# Patient Record
Sex: Male | Born: 1950
Health system: Southern US, Community
[De-identification: ages and names within clinical notes are randomized; demographics above are authoritative.]

## PROBLEM LIST (undated history)

## (undated) DIAGNOSIS — G4730 Sleep apnea, unspecified: Secondary | ICD-10-CM

## (undated) DIAGNOSIS — M1990 Unspecified osteoarthritis, unspecified site: Secondary | ICD-10-CM

## (undated) DIAGNOSIS — C801 Malignant (primary) neoplasm, unspecified: Secondary | ICD-10-CM

## (undated) DIAGNOSIS — K219 Gastro-esophageal reflux disease without esophagitis: Secondary | ICD-10-CM

## (undated) DIAGNOSIS — R7303 Prediabetes: Secondary | ICD-10-CM

## (undated) HISTORY — DX: Sleep apnea, unspecified: G47.30

## (undated) HISTORY — PX: ANAL FISSURE REPAIR: SHX2312

---

## 2004-08-16 ENCOUNTER — Ambulatory Visit: Payer: Self-pay | Admitting: Unknown Physician Specialty

## 2005-08-08 ENCOUNTER — Ambulatory Visit: Payer: Self-pay | Admitting: Gastroenterology

## 2014-05-14 DIAGNOSIS — R079 Chest pain, unspecified: Secondary | ICD-10-CM | POA: Insufficient documentation

## 2014-05-14 DIAGNOSIS — R0609 Other forms of dyspnea: Secondary | ICD-10-CM

## 2014-05-14 DIAGNOSIS — R5383 Other fatigue: Secondary | ICD-10-CM | POA: Insufficient documentation

## 2014-05-14 DIAGNOSIS — R0602 Shortness of breath: Secondary | ICD-10-CM | POA: Insufficient documentation

## 2015-04-14 ENCOUNTER — Encounter: Payer: Self-pay | Admitting: Family Medicine

## 2015-04-14 ENCOUNTER — Ambulatory Visit (INDEPENDENT_AMBULATORY_CARE_PROVIDER_SITE_OTHER): Payer: BLUE CROSS/BLUE SHIELD | Admitting: Family Medicine

## 2015-04-14 VITALS — BP 136/85 | HR 66 | Temp 97.8°F | Ht 71.5 in | Wt 241.0 lb

## 2015-04-14 DIAGNOSIS — H109 Unspecified conjunctivitis: Secondary | ICD-10-CM | POA: Diagnosis not present

## 2015-04-14 MED ORDER — POLYMYXIN B-TRIMETHOPRIM 10000-0.1 UNIT/ML-% OP SOLN: 1.0000 [drp] | OPHTHALMIC | Status: DC

## 2015-04-14 NOTE — Progress Notes (Signed)
   BP 136/85 mmHg  Pulse 66  Temp(Src) 97.8 F (36.6 C)  Ht 5' 11.5" (1.816 m)  Wt 241 lb (109.317 kg)  BMI 33.15 kg/m2  SpO2 95%   Subjective:    Patient ID: Luke Cain, male    DOB: [birth year], 64 y.o.   MRN: [id number]  HPI: Luke Cain is a 64 y.o. male  Chief Complaint  Patient presents with  . left eye pain   patient with pus and mattering developing in his left eye over the last 2-3 days no foreign body sensation no problems with vision. Has had some yellowish pus collecting in his eye on a regular basis. Patient does little work and dust is around a lot.  Relevant past medical, surgical, family and social history reviewed and updated as indicated. Interim medical history since our last visit reviewed. Allergies and medications reviewed and updated.  Review of Systems  Constitutional: Negative.   Respiratory: Negative.   Cardiovascular: Negative.     Per HPI unless specifically indicated above     Objective:    BP 136/85 mmHg  Pulse 66  Temp(Src) 97.8 F (36.6 C)  Ht 5' 11.5" (1.816 m)  Wt 241 lb (109.317 kg)  BMI 33.15 kg/m2  SpO2 95%  Wt Readings from Last 3 Encounters:  08/17/14 235 lb (106.595 kg)  04/14/15 241 lb (109.317 kg)    Physical Exam  Eyes:  Right eye clear left eye with pus mattering. Full range of motion no evidence of foreign body and injected conjunctiva.    No results found for this or any previous visit.    Assessment & Plan:   Problem List Items Addressed This Visit    None    Visit Diagnoses    Conjunctivitis of left eye    -  Primary    Discussed eye care and use of medications. Specially discussed handwashing and sanitation        Follow up plan: Return if symptoms worsen or fail to improve.

## 2015-09-06 ENCOUNTER — Encounter (INDEPENDENT_AMBULATORY_CARE_PROVIDER_SITE_OTHER): Payer: Self-pay | Admitting: Family Medicine

## 2015-09-06 DIAGNOSIS — Z0289 Encounter for other administrative examinations: Secondary | ICD-10-CM

## 2015-09-06 NOTE — Progress Notes (Unsigned)
Patient ID: Luke Cain, male   DOB: 1951-07-15, 64 y.o.   MRN: [id number] [employer]

## 2016-02-07 ENCOUNTER — Ambulatory Visit
Admission: EM | Admit: 2016-02-07 | Discharge: 2016-02-07 | Disposition: A | Discharge status: Home / Self Care | Payer: Commercial Managed Care - HMO | Attending: Family Medicine | Admitting: Family Medicine

## 2016-02-07 ENCOUNTER — Encounter: Payer: Self-pay | Admitting: Emergency Medicine

## 2016-02-07 DIAGNOSIS — W57XXXA Bitten or stung by nonvenomous insect and other nonvenomous arthropods, initial encounter: Secondary | ICD-10-CM

## 2016-02-07 DIAGNOSIS — S30861A Insect bite (nonvenomous) of abdominal wall, initial encounter: Secondary | ICD-10-CM | POA: Diagnosis not present

## 2016-02-07 MED ORDER — DOXYCYCLINE HYCLATE 100 MG PO CAPS: 100.0000 mg | ORAL_CAPSULE | Freq: Two times a day (BID) | ORAL | Status: AC

## 2016-02-07 NOTE — ED Provider Notes (Signed)
CSN: [id number]     Arrival date & time 02/07/16  0902 History   First MD Initiated Contact with Patient 02/07/16 [phone]     Chief Complaint  Patient presents with  . Insect Bite   (Consider location/radiation/quality/duration/timing/severity/associated sxs/prior Treatment) HPI Comments: Married caucasian male here for rash after tick bite engorged on abdomen two weeks ago spouse removed thinks he may have Lyme disease and need treatment would like blood tests along with treatment.  Headache, nausea, bullseye rash abdomen and just not feeling well yesterday.  Today a little better.  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  PMHx tobacco use stopped, chest pain, conjuctivitis PSHx denied  FHx F-CHF and diabetes  The history is provided by the patient.    Past Medical History  Diagnosis Date  . Sleep apnea    Past Surgical History  Procedure Laterality Date  . Anal fissure repair     Family History  Problem Relation Age of Onset  . Thyroid disease Mother   . Diabetes Father   . Heart disease Father    Social History  Substance Use Topics  . Smoking status: Former Smoker    Quit date: 04/14/1999  . Smokeless tobacco: Never Used  . Alcohol Use: Yes    Review of Systems  Constitutional: Positive for fatigue. Negative for fever, chills, diaphoresis, activity change, appetite change and unexpected weight change.  HENT: Negative for congestion, dental problem, drooling, ear discharge, ear pain, facial swelling, hearing loss, mouth sores, nosebleeds, postnasal drip, rhinorrhea, sinus pressure, sneezing, sore throat, tinnitus, trouble swallowing and voice change.   Eyes: Negative for photophobia, pain, discharge, redness, itching and visual disturbance.  Respiratory: Negative for cough, choking, chest tightness, shortness of breath, wheezing and stridor.   Cardiovascular: Negative for chest pain, palpitations and leg swelling.  Gastrointestinal: Positive for  nausea. Negative for vomiting, abdominal pain, diarrhea, constipation, blood in stool and abdominal distention.  Endocrine: Negative for cold intolerance and heat intolerance.  Genitourinary: Negative for dysuria.  Musculoskeletal: Negative for myalgias, back pain, joint swelling, arthralgias, gait problem, neck pain and neck stiffness.  Skin: Positive for color change and rash. Negative for pallor and wound.  Allergic/Immunologic: Positive for environmental allergies. Negative for food allergies and immunocompromised state.  Neurological: Positive for headaches. Negative for dizziness, tremors, seizures, syncope, facial asymmetry, speech difficulty, weakness, light-headedness and numbness.  Hematological: Negative for adenopathy. Does not bruise/bleed easily.  Psychiatric/Behavioral: Negative for behavioral problems, confusion, sleep disturbance and agitation.    Allergies  Review of patient's allergies indicates no known allergies.  Home Medications   Prior to Admission medications   Medication Sig Start Date End Date Taking? Authorizing Provider  alprostadil (EDEX) 40 MCG injection 40 mcg by Intracavitary route as needed for erectile dysfunction. use no more than 3 times per week    Historical Provider, MD  doxycycline (VIBRAMYCIN) 100 MG capsule Take 1 capsule (100 mg total) by mouth 2 (two) times daily. 02/07/16 02/28/16  Olen Cordial, NP   Meds Ordered and Administered this Visit  Medications - No data to display  BP 144/90 mmHg  Pulse 64  Temp(Src) 97.8 F (36.6 C) (Oral)  Resp 18  Ht 6' (1.829 m)  Wt 250 lb (113.399 kg)  BMI 33.90 kg/m2  SpO2 98% No data found.   Physical Exam  Constitutional: He is oriented to person, place, and time. Vital signs are normal. He appears well-developed and well-nourished. He is active and cooperative.  Non-toxic appearance.  He does not have a sickly appearance. He does not appear ill. No distress.  HENT:  Head: Normocephalic and  atraumatic.  Right Ear: Hearing, external ear and ear canal normal.  Left Ear: Hearing, external ear and ear canal normal.  Nose: No mucosal edema, rhinorrhea, nose lacerations, sinus tenderness, nasal deformity, septal deviation or nasal septal hematoma. No epistaxis.  No foreign bodies. Right sinus exhibits no maxillary sinus tenderness and no frontal sinus tenderness. Left sinus exhibits no maxillary sinus tenderness and no frontal sinus tenderness.  Mouth/Throat: Uvula is midline and mucous membranes are normal. Mucous membranes are not pale, not dry and not cyanotic. He does not have dentures. No oral lesions. No trismus in the jaw. Normal dentition. No dental abscesses, uvula swelling, lacerations or dental caries. No oropharyngeal exudate, posterior oropharyngeal edema, posterior oropharyngeal erythema or tonsillar abscesses.  Eyes: Conjunctivae, EOM and lids are normal. Pupils are equal, round, and reactive to light. Right eye exhibits no chemosis, no discharge, no exudate and no hordeolum. No foreign body present in the right eye. Left eye exhibits no chemosis, no discharge, no exudate and no hordeolum. No foreign body present in the left eye. Right conjunctiva is not injected. Right conjunctiva has no hemorrhage. Left conjunctiva is not injected. Left conjunctiva has no hemorrhage. No scleral icterus. Right eye exhibits normal extraocular motion and no nystagmus. Left eye exhibits normal extraocular motion and no nystagmus. Right pupil is round and reactive. Left pupil is round and reactive. Pupils are equal.  Neck: Trachea normal and normal range of motion. Neck supple. No tracheal tenderness, no spinous process tenderness and no muscular tenderness present. No rigidity. No tracheal deviation, no edema, no erythema and normal range of motion present. No thyroid mass and no thyromegaly present.  Cardiovascular: Normal rate, regular rhythm, S1 normal, S2 normal, normal heart sounds and intact distal  pulses.  PMI is not displaced.  Exam reveals no gallop and no friction rub.   No murmur heard. Pulmonary/Chest: Effort normal and breath sounds normal. No stridor. No respiratory distress. He has no decreased breath sounds. He has no wheezes. He has no rhonchi. He has no rales.  Abdominal: Soft. Bowel sounds are normal. He exhibits no distension. There is no hepatosplenomegaly. There is no tenderness. Hernia confirmed negative in the ventral area.  Musculoskeletal: Normal range of motion. He exhibits no edema or tenderness.       Right shoulder: Normal.       Left shoulder: Normal.       Right elbow: Normal.      Left elbow: Normal.       Right hip: Normal.       Left hip: Normal.       Right knee: Normal.       Left knee: Normal.       Cervical back: Normal.       Right hand: Normal.       Left hand: Normal.  Lymphadenopathy:       Head (right side): No submental, no submandibular, no tonsillar, no preauricular, no posterior auricular and no occipital adenopathy present.       Head (left side): No submental, no submandibular, no tonsillar, no preauricular, no posterior auricular and no occipital adenopathy present.    He has no cervical adenopathy.       Right cervical: No superficial cervical, no deep cervical and no posterior cervical adenopathy present.      Left cervical: No superficial cervical, no deep cervical and  no posterior cervical adenopathy present.  Neurological: He is alert and oriented to person, place, and time. He displays no atrophy and no tremor. No cranial nerve deficit or sensory deficit. He exhibits normal muscle tone. He displays no seizure activity. Coordination and gait normal. GCS eye subscore is 4. GCS verbal subscore is 5. GCS motor subscore is 6.  Skin: Skin is warm, dry and intact. Rash noted. No abrasion, no bruising, no burn, no ecchymosis, no laceration, no lesion and no petechiae noted. Rash is macular and maculopapular. He is not diaphoretic. There is  erythema. No cyanosis. No pallor. Nails show no clubbing.     2 brown scabs 56mm left upper midline abdomen surrounded by macular erythema approximately 2cm diameter  Psychiatric: He has a normal mood and affect. His speech is normal and behavior is normal. Judgment and thought content normal. Cognition and memory are normal.  Nursing note and vitals reviewed.   ED Course  Procedures (including critical care time)  Labs Review Labs Reviewed  ROCKY MTN SPOTTED FVR ABS PNL(IGG+IGM)  B. BURGDORFI ANTIBODIES    Imaging Review No results found.  1015 Lyme and RMSF antibodies drawn/venipuncture by RN Tula Nakayama.   MDM   1. Tick bite of abdomen, initial encounter    Has been longer than 72 hours.  Patient requesting labs and treatment therefore will start with 10 days doxycycline 100mg  po BID and extend to 21 days if labs positive.  Bullseye rash abdomen and general malaise today.  Discussed labs ship out approximately 48 hours to get results.  Patient to follow up with PCM or [place] if worsening symptoms or new symptoms for re-evaluation e.g. Fever, lethargy, headache, hematemesis/hematuria/hemoptysis, wheezing, rash.  Patient given exitcare handout on lyme disease, [place] Spotted fever and tick bites.  Refused work note.  Avoid scratching affected area follow up if worsening erythema or purulent discharge despite doxycycline use x 48 hours for re-evaluation.  Patient verbalized understanding of information/instructions, agreed with plan of care and had no further questions at this time.    Olen Cordial, NP 02/07/16 1024

## 2016-02-07 NOTE — ED Notes (Signed)
Pt with tick bite two weeks ago and was feeling sick yesterday with some nausea.

## 2016-02-07 NOTE — Discharge Instructions (Signed)
Tick Bite Information Ticks are insects that attach themselves to the skin and draw blood for food. There are various types of ticks. Common types include wood ticks and deer ticks. Most ticks live in shrubs and grassy areas. Ticks can climb onto your body when you make contact with leaves or grass where the tick is waiting. The most common places on the body for ticks to attach themselves are the scalp, neck, armpits, waist, and groin. Most tick bites are harmless, but sometimes ticks carry germs that cause diseases. These germs can be spread to a person during the tick's feeding process. The chance of a disease spreading through a tick bite depends on:   The type of tick.  Time of year.   How long the tick is attached.   Geographic location.  HOW CAN YOU PREVENT TICK BITES? Take these steps to help prevent tick bites when you are outdoors:  Wear protective clothing. Long sleeves and long pants are best.   Wear [place] clothes so you can see ticks more easily.  Tuck your pant legs into your socks.   If walking on a trail, stay in the middle of the trail to avoid brushing against bushes.  Avoid walking through areas with long grass.  Put insect repellent on all exposed skin and along boot tops, pant legs, and sleeve cuffs.   Check clothing, hair, and skin repeatedly and before going inside.   Brush off any ticks that are not attached.  Take a shower or bath as soon as possible after being outdoors.  WHAT IS THE PROPER WAY TO REMOVE A TICK? Ticks should be removed as soon as possible to help prevent diseases caused by tick bites.  If latex gloves are available, put them on before trying to remove a tick.   Using fine-point tweezers, grasp the tick as close to the skin as possible. You may also use curved forceps or a tick removal tool. Grasp the tick as close to its head as possible. Avoid grasping the tick on its body.  Pull gently with steady upward pressure until the  tick lets go. Do not twist the tick or jerk it suddenly. This may break off the tick's head or mouth parts.  Do not squeeze or crush the tick's body. This could force disease-carrying fluids from the tick into your body.   After the tick is removed, wash the bite area and your hands with soap and water or other disinfectant such as alcohol.  Apply a small amount of antiseptic cream or ointment to the bite site.   Wash and disinfect any instruments that were used.  Do not try to remove a tick by applying a hot match, petroleum jelly, or fingernail polish to the tick. These methods do not work and may increase the chances of disease being spread from the tick bite.  WHEN SHOULD YOU SEEK MEDICAL CARE? Contact your health care provider if you are unable to remove a tick from your skin or if a part of the tick breaks off and is stuck in the skin.  After a tick bite, you need to be aware of signs and symptoms that could be related to diseases spread by ticks. Contact your health care provider if you develop any of the following in the days or weeks after the tick bite:  Unexplained fever.  Rash. A circular rash that appears days or weeks after the tick bite may indicate the possibility of Lyme disease. The rash may resemble  a target with a bull's-eye and may occur at a different part of your body than the tick bite.  Redness and swelling in the area of the tick bite.   Tender, swollen lymph glands.   Diarrhea.   Weight loss.   Cough.   Fatigue.   Muscle, joint, or bone pain.   Abdominal pain.   Headache.   Lethargy or a change in your level of consciousness.  Difficulty walking or moving your legs.   Numbness in the legs.   Paralysis.  Shortness of breath.   Confusion.   Repeated vomiting.    This information is not intended to replace advice given to you by your health care provider. Make sure you discuss any questions you have with your health care  provider.   Document Released: 09/08/2000 Document Revised: 10/02/2014 Document Reviewed: 02/19/2013 Elsevier Interactive Patient Education 2016 Else[employer] Lyme Disease Lyme disease is an infection that affects many parts of the body, including the skin, joints, and nervous system. CAUSES Lyme disease is caused by bacteria called Borrelia burgdorferi. You can get Lyme disease by being bitten by an infected tick. The tick must be attached to your skin for at least 36 hours to transmit the infection. Deer often carry infected ticks. RISK FACTORS  Living in or visiting [place] states, or the [place].  Spending time in wooded or grassy areas.  Being outdoors with exposed skin.  Failing to remove a tick from your skin within 3-4 days. SIGNS AND SYMPTOMS  A round, red rash that comes out from the center of the tick bite. This is the first sign of infection. The center of the rash may be blood colored or have tiny blisters.  Fatigue.  Headache.  Chills and fever.  General achiness.  Joint pain, often in the knee.  Swollen lymph glands. DIAGNOSIS Lyme disease is diagnosed with a medical history, physical exam, and blood test. TREATMENT The main treatment is antibiotic medicine, usually taken by mouth. The length of treatment depends on how soon after a tick bite you begin taking the medicine. In some cases, treatment is necessary for several weeks. If the infection is severe, IV antibiotics may be necessary. HOME CARE INSTRUCTIONS  Take your antibiotic medicine as directed by your health care provider. Finish the antibiotic even if you start to feel better.  You may take a probiotic in between doses of your antibiotic to help avoid stomach upset or diarrhea.  Check with your health care provider before supplementing your treatment. Many alternative therapies have not been proven and may be harmful to you.  Keep all follow-up visits as directed by  your health care provider. This is important. PREVENTION Reinfection is possible with another tick bite by an infected tick. Take these precautions to prevent an infection:  Cover your skin with light-colored clothing when outdoors in the spring and summer months.  Spray clothing and skin with bug spray. The spray should be 20-30% DEET.  Avoided wooded, grassy, and shaded areas.  Remove yard litter, brush, trash, and plants that attract deer and rodents.  Check yourself for ticks when you come indoors.  Wash clothing worn each day.  Check your pets for ticks before they come inside.  If you find a tick:  Remove it with tweezers.  Clean your hands and the bite area with rubbing alcohol or soap and water. Pregnant women should take special care to avoid tick bites because the infection can be passed along  to the fetus. SEEK MEDICAL CARE IF:  You have symptoms after treatment.  You have removed a tick and want to bring it to your health care provider for testing. SEEK IMMEDIATE MEDICAL CARE IF:  You have an irregular heartbeat.  You have nerve pain.  Your face feels numb. MAKE SURE YOU:  Understand these instructions.  Will watch your condition.  Will get help right away if you are not doing well or get worse.   This information is not intended to replace advice given to you by your health care provider. Make sure you discuss any questions you have with your health care provider.   Document Released: 12/18/2000 Document Revised: 10/02/2014 Document Reviewed: 01/27/2014 Elsevier Interactive Patient Education 2016 [place] Spotted Fever [facility] spotted fever is an illness that is spread to people by infected ticks. The illness causes flulike symptoms and a reddish-purple rash. This illness can quickly become very serious. Treatment must be started right away. When the illness is not treated right away, it can sometimes lead to long-term health  problems or even death. This illness is most common during warm weather when ticks are most active. CAUSES [facility] spotted fever is caused by a type of bacteria that is called Rickettsia rickettsii. This type of bacteria is carried by [place] dog ticks and [employer]. People get infected through a bite from a tick that is infected with the bacteria. The bite is painless, and it frequently goes unnoticed. The bacteria can also infect a person when tick blood or tick feces get into a person's body through damaged skin. A tick bite is not necessary for an infection to occur. People can get [facility] spotted fever if they get a tick's blood or body fluids on their skin in the area of a small cut or sore. This could happen while removing a tick from another person or a dog. The infection is not contagious, and it cannot be spread (transmitted) from person to person. SIGNS AND SYMPTOMS Symptoms may begin 2-14 days after a tick bite. The most common early symptoms are:  Fever.  Muscle aches.  Headache.  Nausea.  Vomiting.  Poor appetite.  Abdominal pain. The reddish-purple rash usually appears 3-5 days after the first symptoms begin. The rash often starts on the wrists and ankles. It may then spread to the palms, the soles of the feet, the legs, and the trunk. DIAGNOSIS Diagnosis is based on a physical exam, medical history, and blood tests. Your health care provider may suspect [facility] spotted fever in one of these cases:   If you have recently been bitten by a tick.  If you have been in areas that have a lot of ticks or in areas where the disease is common. TREATMENT It is important to begin treatment right away. Treatment will usually involve the use of antibiotic medicines. In some cases, your health care provider may begin treatment before the diagnosis is confirmed. If your symptoms are severe, a hospital stay may be needed. HOME CARE  INSTRUCTIONS  Rest as much as possible until you feel better.  Take medicines only as directed by your health care provider.  Take your antibiotic medicine as directed by your health care provider. Finish the antibiotic even if you start to feel better.  Drink enough fluid to keep your urine clear or pale yellow.  Keep all follow-up visits as directed by your health care provider. This is important. PREVENTION Avoiding tick  bites can help to prevent this illness. Take these steps to avoid tick bites when you are outdoors:  Be aware that most ticks live in shrubs, low tree branches, and grassy areas. A tick can climb onto your body when you make contact with leaves or grass where the tick is waiting.  Wear protective clothing. Long sleeves and long pants are best.  Wear white clothes so you can see ticks more easily.  Tuck your pant legs into your socks.  If you go walking on a trail, stay in the middle of the trail to avoid brushing against bushes.  Avoid walking through areas that have long grass.  Put insect repellent on all exposed skin and along boot tops, pant legs, and sleeve cuffs.  Check clothing, hair, and skin repeatedly and before going inside.  Check family members and pets for ticks.  Brush off any ticks that are not attached.  Take a shower or a bath as soon as possible after you have been outdoors. Check your skin for ticks. The most common places on the body where ticks attach themselves are the scalp, neck, armpits, waist, and groin. You can also greatly reduce your chances of getting [facility] spotted fever if you remove attached ticks as soon as possible. To remove an attached tick, use a forceps or fine-point tweezers to detach the intact tick without leaving its mouth parts in the skin. The wound from the tick bite should be washed after the tick has been removed. SEEK MEDICAL CARE IF:  You have drainage, swelling, or increased redness or pain in the  area of the rash. SEEK IMMEDIATE MEDICAL CARE IF:  You have chest pain.  You have shortness of breath.  You have a severe headache.  You have a seizure.  You have severe abdominal pain.  You are feeling confused.  You are bruising easily.  You have bleeding from your gums.  You have blood in your stool.   This information is not intended to replace advice given to you by your health care provider. Make sure you discuss any questions you have with your health care provider.   Document Released: 12/24/2000 Document Revised: 10/02/2014 Document Reviewed: 04/27/2014 Elsevier Interactive Patient Education [employer].

## 2016-02-08 ENCOUNTER — Ambulatory Visit: Payer: Commercial Managed Care - HMO | Admitting: Unknown Physician Specialty

## 2016-02-09 LAB — ROCKY MTN SPOTTED FVR ABS PNL(IGG+IGM)
RMSF IgG: POSITIVE — AB
RMSF IgM: 1.16 index — ABNORMAL HIGH (ref 0.00–0.89)

## 2016-02-09 LAB — B. BURGDORFI ANTIBODIES: B burgdorferi Ab IgG+IgM: <0.91 {ISR} (ref 0.00–0.90)

## 2016-02-09 LAB — RMSF, IGG, IFA: RMSF, IGG, IFA: 1:256 {titer} — ABNORMAL HIGH

## 2016-02-12 ENCOUNTER — Telehealth: Payer: Self-pay | Admitting: Family Medicine

## 2016-02-12 NOTE — Telephone Encounter (Signed)
Patient notified via telephone Lyme test negative but RMSF IgG and IgM both positive.  Continue doxycycline 100mg  po BID as previously discussed.  Patient reported still taking advil prn mild fever.  Patient to follow up with [facility] for re-evaluation if fever lasting longer than 1 week, headache, GI symptoms, myalgia, continued rash as therapy may require adjustment.  Try to avoid further tick bites use DEET/permethrin spray/clothing to prevent tick adherence.  Patient verbalized understanding information/instructions, agreed with plan of care and had no further questions at this time.

## 2016-02-15 ENCOUNTER — Encounter: Payer: Self-pay | Admitting: Family Medicine

## 2016-02-15 ENCOUNTER — Ambulatory Visit (INDEPENDENT_AMBULATORY_CARE_PROVIDER_SITE_OTHER): Payer: Commercial Managed Care - HMO | Admitting: Family Medicine

## 2016-02-15 VITALS — BP 122/82 | HR 80 | Temp 97.8°F | Ht 71.5 in | Wt 247.0 lb

## 2016-02-15 DIAGNOSIS — A770 Spotted fever due to Rickettsia rickettsii: Secondary | ICD-10-CM

## 2016-02-15 NOTE — Progress Notes (Signed)
BP 122/82 mmHg  Pulse 80  Temp(Src) 97.8 F (36.6 C)  Ht 5' 11.5" (1.816 m)  Wt 247 lb (112.038 kg)  BMI 33.97 kg/m2  SpO2 98%   Subjective:    Patient ID: Luke Cain, male    DOB: 01-26-1951, 65 y.o.   MRN: [id number]  HPI: [name] is a 65 y.o. male  Chief Complaint  Patient presents with  . follow up for RMSF  Patient diagnosed week ago with RMSF has been taking doxycycline twice a day without fail to slowly improving and having some problems with decision-making and clear thinking. Patient with no rash no fever or chills just feels bad with systemic symptoms and some generalized aching. No sunburn with doxycycline use and sun precautions. Patient concerned not getting better and this or something else to do.  Relevant past medical, surgical, family and social history reviewed and updated as indicated. Interim medical history since our last visit reviewed. Allergies and medications reviewed and updated.  Review of Systems  Constitutional: Positive for diaphoresis and fatigue. Negative for fever and chills.  Respiratory: Negative for cough, choking, chest tightness and shortness of breath.   Cardiovascular: Negative.   Gastrointestinal: Negative.   Skin: Negative.     Per HPI unless specifically indicated above     Objective:    BP 122/82 mmHg  Pulse 80  Temp(Src) 97.8 F (36.6 C)  Ht 5' 11.5" (1.816 m)  Wt 247 lb (112.038 kg)  BMI 33.97 kg/m2  SpO2 98%  Wt Readings from Last 3 Encounters:  02/15/16 247 lb (112.038 kg)  02/07/16 250 lb (113.399 kg)  09/06/15 237 lb (107.502 kg)    Physical Exam  Constitutional: He is oriented to person, place, and time. He appears well-developed and well-nourished. No distress.  HENT:  Head: Normocephalic and atraumatic.  Right Ear: Hearing and external ear normal.  Left Ear: Hearing and external ear normal.  Nose: Nose normal.  Mouth/Throat: Oropharynx is clear and moist.  Eyes: Conjunctivae and lids are  normal. Right eye exhibits no discharge. Left eye exhibits no discharge. No scleral icterus.  Cardiovascular: Normal rate, regular rhythm and normal heart sounds.   Pulmonary/Chest: Effort normal and breath sounds normal. No respiratory distress. He has no wheezes. He has no rales.  Abdominal: Soft. Bowel sounds are normal.  Musculoskeletal: Normal range of motion.  Neurological: He is alert and oriented to person, place, and time.  Skin: Skin is intact. No rash noted.  Psychiatric: He has a normal mood and affect. His speech is normal and behavior is normal. Judgment and thought content normal. Cognition and memory are normal.    Results for orders placed or performed during the hospital encounter of 02/07/16  Rocky mtn spotted fvr abs pnl(IgG+IgM)  Result Value Ref Range   RMSF IgG Positive (A) Negative   RMSF IgM 1.16 (H) 0.00 - 0.89 index  B. burgdorfi antibodies  Result Value Ref Range   B burgdorferi Ab IgG+IgM <0.91 0.00 - 0.90 ISR  RMSF, IgG, IFA  Result Value Ref Range   RMSF, IGG, IFA 1:256 (H) Neg <1:64      Assessment & Plan:   Problem List Items Addressed This Visit    None    Visit Diagnoses    RMSF [facility] spotted fever)    -  Primary    Slow resolution as per usual course will continue doxycycline medication education given especially on mental alertness and decision making  Discussed continuing doxycycline until completely done. Follow up plan: Return for As scheduled.

## 2016-05-15 ENCOUNTER — Telehealth: Payer: Self-pay

## 2016-05-15 NOTE — Telephone Encounter (Signed)
Patient has injured his calf muscle

## 2016-10-23 ENCOUNTER — Telehealth: Payer: Self-pay

## 2016-10-23 DIAGNOSIS — R600 Localized edema: Secondary | ICD-10-CM

## 2016-10-23 NOTE — Telephone Encounter (Signed)
Call pt 

## 2016-10-23 NOTE — Telephone Encounter (Signed)
Patient asked if Dr. Jeananne Rama or his CMA would call him. Patient is having some issues with his leg.

## 2016-10-23 NOTE — Telephone Encounter (Signed)
Spoke with patient. He has been experiencing bilateral soreness in his lower extremities, it has been ongoing x 1 week. It started low in his legs, and has progressed upwards. Pt has noticed some swelling in legs over the last week at the top of his sock. However this morning he woke up and his left leg was very swollen. Has some redness in that leg. It is very tight, pt denies any warmth in leg. Please advise.

## 2016-10-24 ENCOUNTER — Encounter: Payer: Self-pay | Admitting: Family Medicine

## 2016-10-24 ENCOUNTER — Ambulatory Visit (INDEPENDENT_AMBULATORY_CARE_PROVIDER_SITE_OTHER): Payer: Commercial Managed Care - HMO | Admitting: Family Medicine

## 2016-10-24 ENCOUNTER — Ambulatory Visit (INDEPENDENT_AMBULATORY_CARE_PROVIDER_SITE_OTHER): Payer: Commercial Managed Care - HMO

## 2016-10-24 VITALS — BP 145/81 | HR 68 | Ht 72.0 in | Wt 242.0 lb

## 2016-10-24 DIAGNOSIS — R600 Localized edema: Secondary | ICD-10-CM

## 2016-10-24 DIAGNOSIS — Z23 Encounter for immunization: Secondary | ICD-10-CM

## 2016-10-24 DIAGNOSIS — Z131 Encounter for screening for diabetes mellitus: Secondary | ICD-10-CM | POA: Diagnosis not present

## 2016-10-24 LAB — CBC WITH DIFFERENTIAL/PLATELET
HEMATOCRIT: 41.3 % (ref 37.5–51.0)
Hemoglobin: 14.3 g/dL (ref 13.0–17.7)
Lymphocytes Absolute: 1.7 10*3/uL (ref 0.7–3.1)
Lymphs: 31 %
MCH: 31.8 pg (ref 26.6–33.0)
MCHC: 34.6 g/dL (ref 31.5–35.7)
MCV: 92 fL (ref 79–97)
MID (Absolute): 0.4 10*3/uL (ref 0.1–1.6)
MID: 8 %
NEUTROS ABS: 3.4 10*3/uL (ref 1.4–7.0)
Neutrophils: 62 %
Platelets: 116 10*3/uL — ABNORMAL LOW (ref 150–379)
RBC: 4.49 x10E6/uL (ref 4.14–5.80)
RDW: 12.9 % (ref 12.3–15.4)
WBC: 5.5 10*3/uL (ref 3.4–10.8)

## 2016-10-24 LAB — BAYER DCA HB A1C WAIVED: HB A1C: 6.1 % (ref <7.0)

## 2016-10-24 NOTE — Assessment & Plan Note (Signed)
Discuss left lower leg edema with high risk for concern of DVT. Patient's CBC was normal here. His A1c was still in the prediabetes range. Radiology pending with venous ultrasound ordered stat which will be done this morning. Reviewed old records with no prior DVT. Discuss possibilities for diagnosis for muscle strain, ankle strain, overuse injuries. Discussed various treatments but pending ultrasound report and possibility for treatment of DVT and anticoagulation.

## 2016-10-24 NOTE — Telephone Encounter (Signed)
Patient was saw by Dr. Jeananne Rama today and was sent to [facility] for DVT.

## 2016-10-24 NOTE — Progress Notes (Signed)
Ht 6' (1.829 m)   Wt 252 lb (114.3 kg)   BMI 34.18 kg/m    Subjective:    Patient ID: Luke Cain, male    DOB: 08-24-1951, 66 y.o.   MRN: [id number]  HPI: Luke Cain is a 66 y.o. male  Chief Complaint  Patient presents with  . DVT   Patient concerned about nonspecific swelling left lower leg no known trauma irritation does have a little black and blue on the left side right leg no swelling. Patient's concerned about blood clots swelling seems to be moving up his leg with discomfort also moving up his leg. This is been ongoing about a week. No prior issues with blood clots  Relevant past medical, surgical, family and social history reviewed and updated as indicated. Interim medical history since our last visit reviewed. Allergies and medications reviewed and updated.  Review of Systems  Constitutional: Negative.   HENT: Negative.   Eyes: Negative.   Respiratory: Negative.   Cardiovascular: Negative.   Gastrointestinal: Negative.   Endocrine: Negative.   Genitourinary: Negative.   Musculoskeletal: Negative.   Neurological: Negative.   Hematological: Negative.     Per HPI unless specifically indicated above     Objective:    Ht 6' (1.829 m)   Wt 252 lb (114.3 kg)   BMI 34.18 kg/m   Wt Readings from Last 3 Encounters:  10/24/16 252 lb (114.3 kg)  02/15/16 247 lb (112 kg)  02/07/16 250 lb (113.4 kg)    Physical Exam  Constitutional: He is oriented to person, place, and time. He appears well-developed and well-nourished. No distress.  HENT:  Head: Normocephalic and atraumatic.  Right Ear: Hearing normal.  Left Ear: Hearing normal.  Nose: Nose normal.  Eyes: Conjunctivae and lids are normal. Right eye exhibits no discharge. Left eye exhibits no discharge. No scleral icterus.  Neck: No JVD present. No thyromegaly present.  Cardiovascular: Normal rate, regular rhythm, normal heart sounds and intact distal pulses.   Pulmonary/Chest: Effort normal and  breath sounds normal. No respiratory distress. He has no wheezes. He has no rales. He exhibits no tenderness.  Abdominal: Soft.  Musculoskeletal: Normal range of motion. He exhibits edema.  Left leg with 3+ edema up to mid calf with some mild excoriation on the left lateral calf area with some bruising in this area. No ankle soreness no knee soreness his calf is tender to squeezing and palpation no definite cords felt  Lymphadenopathy:    He has no cervical adenopathy.  Neurological: He is alert and oriented to person, place, and time. He has normal reflexes. No cranial nerve deficit.  Skin: Skin is warm, dry and intact. No rash noted. No erythema.  Psychiatric: He has a normal mood and affect. His speech is normal and behavior is normal. Judgment and thought content normal. Cognition and memory are normal.    Results for orders placed or performed during the hospital encounter of 02/07/16  Rocky mtn spotted fvr abs pnl(IgG+IgM)  Result Value Ref Range   RMSF IgG Positive (A) Negative   RMSF IgM 1.16 (H) 0.00 - 0.89 index  B. burgdorfi antibodies  Result Value Ref Range   B burgdorferi Ab IgG+IgM <0.91 0.00 - 0.90 ISR  RMSF, IgG, IFA  Result Value Ref Range   RMSF, IGG, IFA 1:256 (H) Neg <1:64      Assessment & Plan:   Problem List Items Addressed This Visit      Other   Lower  leg edema - Primary    Discuss left lower leg edema with high risk for concern of DVT. Patient's CBC was normal here. His A1c was still in the prediabetes range. Radiology pending with venous ultrasound ordered stat which will be done this morning. Reviewed old records with no prior DVT. Discuss possibilities for diagnosis for muscle strain, ankle strain, overuse injuries. Discussed various treatments but pending ultrasound report and possibility for treatment of DVT and anticoagulation.      Relevant Orders   CBC With Differential/Platelet (STAT)   Bayer DCA Hb A1c Waived (STAT)    Other Visit  Diagnoses    Screening for diabetes mellitus (DM)       Relevant Orders   Bayer DCA Hb A1c Waived (STAT)   Need for influenza vaccination       Relevant Orders   Flu Vaccine QUAD 36+ mos PF IM (Fluarix & Fluzone Quad PF) (Completed)       Follow up plan: Return for Physical Exam, this spring.

## 2016-10-25 ENCOUNTER — Encounter (INDEPENDENT_AMBULATORY_CARE_PROVIDER_SITE_OTHER): Payer: Self-pay

## 2016-10-30 ENCOUNTER — Ambulatory Visit: Payer: Commercial Managed Care - HMO | Admitting: Family Medicine

## 2016-10-30 ENCOUNTER — Ambulatory Visit: Payer: Self-pay | Admitting: Family Medicine

## 2016-12-27 ENCOUNTER — Encounter: Payer: Self-pay | Admitting: Family Medicine

## 2016-12-27 ENCOUNTER — Ambulatory Visit (INDEPENDENT_AMBULATORY_CARE_PROVIDER_SITE_OTHER): Payer: Commercial Managed Care - HMO | Admitting: Family Medicine

## 2016-12-27 VITALS — BP 133/81 | HR 69 | Ht 71.65 in | Wt 231.0 lb

## 2016-12-27 DIAGNOSIS — N529 Male erectile dysfunction, unspecified: Secondary | ICD-10-CM

## 2016-12-27 DIAGNOSIS — Z1211 Encounter for screening for malignant neoplasm of colon: Secondary | ICD-10-CM

## 2016-12-27 DIAGNOSIS — Z13220 Encounter for screening for lipoid disorders: Secondary | ICD-10-CM | POA: Diagnosis not present

## 2016-12-27 DIAGNOSIS — Z125 Encounter for screening for malignant neoplasm of prostate: Secondary | ICD-10-CM

## 2016-12-27 DIAGNOSIS — Z0000 Encounter for general adult medical examination without abnormal findings: Secondary | ICD-10-CM

## 2016-12-27 DIAGNOSIS — N400 Enlarged prostate without lower urinary tract symptoms: Secondary | ICD-10-CM | POA: Insufficient documentation

## 2016-12-27 DIAGNOSIS — E1169 Type 2 diabetes mellitus with other specified complication: Secondary | ICD-10-CM | POA: Insufficient documentation

## 2016-12-27 DIAGNOSIS — Z1329 Encounter for screening for other suspected endocrine disorder: Secondary | ICD-10-CM

## 2016-12-27 DIAGNOSIS — R7309 Other abnormal glucose: Secondary | ICD-10-CM | POA: Insufficient documentation

## 2016-12-27 DIAGNOSIS — E119 Type 2 diabetes mellitus without complications: Secondary | ICD-10-CM | POA: Insufficient documentation

## 2016-12-27 DIAGNOSIS — Z23 Encounter for immunization: Secondary | ICD-10-CM

## 2016-12-27 LAB — URINALYSIS, ROUTINE W REFLEX MICROSCOPIC
Bilirubin, UA: NEGATIVE
GLUCOSE, UA: NEGATIVE
Ketones, UA: NEGATIVE
LEUKOCYTES UA: NEGATIVE
Nitrite, UA: NEGATIVE
PH UA: 6 (ref 5.0–7.5)
PROTEIN UA: NEGATIVE
RBC, UA: NEGATIVE
Specific Gravity, UA: <1.005 — ABNORMAL LOW (ref 1.005–1.030)
UUROB: 0.2 mg/dL (ref 0.2–1.0)

## 2016-12-27 LAB — MICROSCOPIC EXAMINATION
Bacteria, UA: NONE SEEN
RBC, UA: NONE SEEN /hpf (ref 0–?)
WBC UA: NONE SEEN /HPF (ref 0–?)

## 2016-12-27 MED ORDER — ZOSTER VACCINE LIVE 19400 UNT/0.65ML ~~LOC~~ SUSR: 0.6500 mL | Freq: Once | SUBCUTANEOUS | 0 refills | Status: AC

## 2016-12-27 NOTE — Assessment & Plan Note (Signed)
Cont inj as has been stable for years

## 2016-12-27 NOTE — Assessment & Plan Note (Signed)
observe

## 2016-12-27 NOTE — Assessment & Plan Note (Signed)
Check a1c 

## 2016-12-27 NOTE — Progress Notes (Signed)
BP 133/81   Pulse 69   Ht 5' 11.65" (1.82 m)   Wt 231 lb (104.8 kg)   SpO2 98%   BMI 31.63 kg/m    Subjective:    Patient ID: Luke Cain, male    DOB: Jun 09, 1951, 66 y.o.   MRN: [id number]  HPI: Luke Cain is a 66 y.o. male  Chief Complaint  Patient presents with  . Annual Exam  Patient all in all doing well no complaints takes medication as listed without problems. The injectable index does well for ED without problems.  Relevant past medical, surgical, family and social history reviewed and updated as indicated. Interim medical history since our last visit reviewed. Allergies and medications reviewed and updated.  Review of Systems  Constitutional: Negative.   HENT: Negative.   Eyes: Negative.   Respiratory: Negative.   Cardiovascular: Negative.   Gastrointestinal: Negative.   Endocrine: Negative.   Genitourinary: Negative.   Musculoskeletal: Negative.   Skin: Negative.   Allergic/Immunologic: Negative.   Neurological: Negative.   Hematological: Negative.   Psychiatric/Behavioral: Negative.     Per HPI unless specifically indicated above     Objective:    BP 133/81   Pulse 69   Ht 5' 11.65" (1.82 m)   Wt 231 lb (104.8 kg)   SpO2 98%   BMI 31.63 kg/m   Wt Readings from Last 3 Encounters:  12/27/16 231 lb (104.8 kg)  10/24/16 242 lb (109.8 kg)  02/15/16 247 lb (112 kg)    Physical Exam  Constitutional: He is oriented to person, place, and time. He appears well-developed and well-nourished.  HENT:  Head: Normocephalic and atraumatic.  Right Ear: External ear normal.  Left Ear: External ear normal.  Eyes: Conjunctivae and EOM are normal. Pupils are equal, round, and reactive to light.  Neck: Normal range of motion. Neck supple.  Cardiovascular: Normal rate, regular rhythm, normal heart sounds and intact distal pulses.   Pulmonary/Chest: Effort normal and breath sounds normal.  Abdominal: Soft. Bowel sounds are normal. There is no  splenomegaly or hepatomegaly.  Genitourinary: Rectum normal and penis normal.  Genitourinary Comments: BPH  Musculoskeletal: Normal range of motion.  Neurological: He is alert and oriented to person, place, and time. He has normal reflexes.  Skin: No rash noted. No erythema.  Psychiatric: He has a normal mood and affect. His behavior is normal. Judgment and thought content normal.    Results for orders placed or performed in visit on 10/24/16  CBC With Differential/Platelet (STAT)  Result Value Ref Range   WBC 5.5 3.4 - 10.8 x10E3/uL   RBC 4.49 4.14 - 5.80 x10E6/uL   Hemoglobin 14.3 13.0 - 17.7 g/dL   Hematocrit 41.3 37.5 - 51.0 %   MCV 92 79 - 97 fL   MCH 31.8 26.6 - 33.0 pg   MCHC 34.6 31.5 - 35.7 g/dL   RDW 12.9 12.3 - 15.4 %   Platelets 116 (L) 150 - 379 x10E3/uL   Neutrophils 62 Not Estab. %   Lymphs 31 Not Estab. %   MID 8 Not Estab. %   Neutrophils Absolute 3.4 1.4 - 7.0 x10E3/uL   Lymphocytes Absolute 1.7 0.7 - 3.1 x10E3/uL   MID (Absolute) 0.4 0.1 - 1.6 X10E3/uL  Bayer DCA Hb A1c Waived (STAT)  Result Value Ref Range   Bayer DCA Hb A1c Waived 6.1 <7.0 %      Assessment & Plan:   Problem List Items Addressed This Visit  Genitourinary   BPH (benign prostatic hyperplasia)    observe      Impotence due to erectile dysfunction    Cont inj as has been stable for years        Other   Elevated glucose    Check a1c      Relevant Orders   Hgb A1c w/o eAG    Other Visit Diagnoses    Annual physical exam    -  Primary   Relevant Orders   CBC with Differential/Platelet   Comprehensive metabolic panel   Lipid panel   PSA   TSH   Urinalysis, Routine w reflex microscopic   Screening cholesterol level       Relevant Orders   Lipid panel   Prostate cancer screening       Relevant Orders   PSA   Thyroid disorder screen       Relevant Orders   TSH   Colon cancer screening       Relevant Orders   Ambulatory referral to Gastroenterology   Need for  Zostavax administration       Relevant Medications   Zoster Vaccine Live, PF, (ZOSTAVAX) 84037 UNT/0.65ML injection       Follow up plan: Return in about 6 months (around 06/28/2017) for Hemoglobin A1c.

## 2016-12-28 ENCOUNTER — Encounter: Payer: Self-pay | Admitting: Family Medicine

## 2016-12-28 LAB — COMPREHENSIVE METABOLIC PANEL
ALT: 41 IU/L (ref 0–44)
AST: 29 IU/L (ref 0–40)
Albumin/Globulin Ratio: 1.8 (ref 1.2–2.2)
Albumin: 4.8 g/dL (ref 3.6–4.8)
Alkaline Phosphatase: 56 IU/L (ref 39–117)
BILIRUBIN TOTAL: 0.6 mg/dL (ref 0.0–1.2)
BUN/Creatinine Ratio: 13 (ref 10–24)
BUN: 15 mg/dL (ref 8–27)
CHLORIDE: 102 mmol/L (ref 96–106)
CO2: 20 mmol/L (ref 18–29)
Calcium: 9.6 mg/dL (ref 8.6–10.2)
Creatinine, Ser: 1.13 mg/dL (ref 0.76–1.27)
GFR calc Af Amer: 79 mL/min/{1.73_m2} (ref >59)
GFR calc non Af Amer: 68 mL/min/{1.73_m2} (ref >59)
GLUCOSE: 98 mg/dL (ref 65–99)
Globulin, Total: 2.7 g/dL (ref 1.5–4.5)
Potassium: 3.8 mmol/L (ref 3.5–5.2)
Sodium: 140 mmol/L (ref 134–144)
TOTAL PROTEIN: 7.5 g/dL (ref 6.0–8.5)

## 2016-12-28 LAB — CBC WITH DIFFERENTIAL/PLATELET
BASOS ABS: 0 10*3/uL (ref 0.0–0.2)
Basos: 0 %
EOS (ABSOLUTE): 0.1 10*3/uL (ref 0.0–0.4)
Eos: 2 %
HEMATOCRIT: 43.2 % (ref 37.5–51.0)
HEMOGLOBIN: 14.8 g/dL (ref 13.0–17.7)
IMMATURE GRANS (ABS): 0 10*3/uL (ref 0.0–0.1)
Immature Granulocytes: 0 %
LYMPHS: 30 %
Lymphocytes Absolute: 1.6 10*3/uL (ref 0.7–3.1)
MCH: 32.3 pg (ref 26.6–33.0)
MCHC: 34.3 g/dL (ref 31.5–35.7)
MCV: 94 fL (ref 79–97)
MONOCYTES: 7 %
Monocytes Absolute: 0.4 10*3/uL (ref 0.1–0.9)
NEUTROS ABS: 3.3 10*3/uL (ref 1.4–7.0)
Neutrophils: 61 %
PLATELETS: 137 10*3/uL — AB (ref 150–379)
RBC: 4.58 x10E6/uL (ref 4.14–5.80)
RDW: 13.5 % (ref 12.3–15.4)
WBC: 5.4 10*3/uL (ref 3.4–10.8)

## 2016-12-28 LAB — LIPID PANEL
CHOLESTEROL TOTAL: 137 mg/dL (ref 100–199)
Chol/HDL Ratio: 3.9 ratio (ref 0.0–5.0)
HDL: 35 mg/dL — ABNORMAL LOW (ref >39)
LDL Calculated: 83 mg/dL (ref 0–99)
TRIGLYCERIDES: 94 mg/dL (ref 0–149)
VLDL CHOLESTEROL CAL: 19 mg/dL (ref 5–40)

## 2016-12-28 LAB — TSH: TSH: 2.1 u[IU]/mL (ref 0.450–4.500)

## 2016-12-28 LAB — HGB A1C W/O EAG: HEMOGLOBIN A1C: 5.5 % (ref 4.8–5.6)

## 2016-12-28 LAB — PSA: Prostate Specific Ag, Serum: 0.3 ng/mL (ref 0.0–4.0)

## 2017-01-05 ENCOUNTER — Other Ambulatory Visit: Payer: Self-pay

## 2017-01-05 ENCOUNTER — Telehealth: Payer: Self-pay

## 2017-01-05 DIAGNOSIS — Z1211 Encounter for screening for malignant neoplasm of colon: Secondary | ICD-10-CM

## 2017-01-05 NOTE — Telephone Encounter (Signed)
Gastroenterology Pre-Procedure Review  Request Date:  Requesting Physician: Dr.   PATIENT REVIEW QUESTIONS: The patient responded to the following health history questions as indicated:    1. Are you having any GI issues? no 2. Do you have a personal history of Polyps? no 3. Do you have a family history of Colon Cancer or Polyps? no 4. Diabetes Mellitus? no 5. Joint replacements in the past 12 months?no 6. Major health problems in the past 3 months?no 7. Any artificial heart valves, MVP, or defibrillator?no    MEDICATIONS & ALLERGIES:    Patient reports the following regarding taking any anticoagulation/antiplatelet therapy:   Plavix, Coumadin, Eliquis, Xarelto, Lovenox, Pradaxa, Brilinta, or Effient? no Aspirin? yes (ASA 81mg )  Patient confirms/reports the following medications:  Current Outpatient Prescriptions  Medication Sig Dispense Refill  . alprostadil (EDEX) 40 MCG injection 40 mcg by Intracavitary route as needed for erectile dysfunction. use no more than 3 times per week    . aspirin EC 81 MG tablet Take by mouth.    . loratadine (CLARITIN) 10 MG tablet Take 10 mg by mouth daily.    . Multiple Vitamin (MULTI-VITAMINS) TABS Take by mouth.    . vitamin B-12 (CYANOCOBALAMIN) 1000 MCG tablet Take 5,000 mcg by mouth.      No current facility-administered medications for this visit.     Patient confirms/reports the following allergies:  No Known Allergies  No orders of the defined types were placed in this encounter.   AUTHORIZATION INFORMATION Primary Insurance: 1D#: Group #:  Secondary Insurance: 1D#: Group #:  SCHEDULE INFORMATION: Date: 01/15/17 Time: Location: [place]

## 2017-01-09 ENCOUNTER — Telehealth: Payer: Self-pay | Admitting: Gastroenterology

## 2017-01-09 NOTE — Telephone Encounter (Signed)
01/09/17 NO Prior Auth required per website for Colonoscopy [id number] / Z12.11 Reference # [id number].

## 2017-01-11 ENCOUNTER — Encounter: Payer: Self-pay | Admitting: *Deleted

## 2017-01-11 ENCOUNTER — Other Ambulatory Visit: Payer: Self-pay

## 2017-01-11 MED ORDER — NA SULFATE-K SULFATE-MG SULF 17.5-3.13-1.6 GM/177ML PO SOLN: 1.0000 | ORAL | 0 refills | Status: DC

## 2017-01-12 NOTE — Discharge Instructions (Signed)

## 2017-01-15 ENCOUNTER — Ambulatory Visit
Admission: RE | Admit: 2017-01-15 | Discharge: 2017-01-15 | Disposition: A | Discharge status: Home / Self Care | Payer: Commercial Managed Care - HMO | Source: Ambulatory Visit | Attending: Gastroenterology | Admitting: Gastroenterology

## 2017-01-15 ENCOUNTER — Ambulatory Visit: Payer: Commercial Managed Care - HMO | Admitting: Anesthesiology

## 2017-01-15 ENCOUNTER — Encounter
Admission: RE | Disposition: A | Discharge status: Home / Self Care | Payer: Self-pay | Source: Ambulatory Visit | Attending: Gastroenterology

## 2017-01-15 DIAGNOSIS — Z87891 Personal history of nicotine dependence: Secondary | ICD-10-CM | POA: Insufficient documentation

## 2017-01-15 DIAGNOSIS — K635 Polyp of colon: Secondary | ICD-10-CM | POA: Insufficient documentation

## 2017-01-15 DIAGNOSIS — Z1211 Encounter for screening for malignant neoplasm of colon: Secondary | ICD-10-CM | POA: Diagnosis not present

## 2017-01-15 DIAGNOSIS — K621 Rectal polyp: Secondary | ICD-10-CM

## 2017-01-15 DIAGNOSIS — Z79899 Other long term (current) drug therapy: Secondary | ICD-10-CM | POA: Insufficient documentation

## 2017-01-15 DIAGNOSIS — G4730 Sleep apnea, unspecified: Secondary | ICD-10-CM | POA: Diagnosis not present

## 2017-01-15 DIAGNOSIS — Z7982 Long term (current) use of aspirin: Secondary | ICD-10-CM | POA: Diagnosis not present

## 2017-01-15 DIAGNOSIS — Z7189 Other specified counseling: Secondary | ICD-10-CM

## 2017-01-15 DIAGNOSIS — K640 First degree hemorrhoids: Secondary | ICD-10-CM | POA: Diagnosis not present

## 2017-01-15 HISTORY — PX: POLYPECTOMY: SHX5525

## 2017-01-15 HISTORY — PX: COLONOSCOPY WITH PROPOFOL: SHX5780

## 2017-01-15 HISTORY — DX: Gastro-esophageal reflux disease without esophagitis: K21.9

## 2017-01-15 HISTORY — DX: Unspecified osteoarthritis, unspecified site: M19.90

## 2017-01-15 SURGERY — COLONOSCOPY WITH PROPOFOL
Anesthesia: Monitor Anesthesia Care | Wound class: Contaminated

## 2017-01-15 MED ORDER — SIMETHICONE 40 MG/0.6ML PO SUSP
ORAL | Status: DC | PRN
  Administered 2017-01-15: 09:00:00

## 2017-01-15 MED ORDER — LIDOCAINE HCL (CARDIAC) 20 MG/ML IV SOLN
INTRAVENOUS | Status: DC | PRN
Start: 2017-01-15 — End: 2017-01-15
  Administered 2017-01-15: 30 mg via INTRAVENOUS

## 2017-01-15 MED ORDER — LACTATED RINGERS IV SOLN
10.0000 mL/h | INTRAVENOUS | Status: DC
  Administered 2017-01-15: 10 mL/h via INTRAVENOUS

## 2017-01-15 MED ORDER — PROPOFOL 10 MG/ML IV BOLUS
INTRAVENOUS | Status: DC | PRN
Start: 2017-01-15 — End: 2017-01-15
  Administered 2017-01-15: 30 mg via INTRAVENOUS
  Administered 2017-01-15: 20 mg via INTRAVENOUS
  Administered 2017-01-15: 100 mg via INTRAVENOUS
  Administered 2017-01-15: 20 mg via INTRAVENOUS
  Administered 2017-01-15: 50 mg via INTRAVENOUS

## 2017-01-15 SURGICAL SUPPLY — 23 items

## 2017-01-15 NOTE — Op Note (Signed)
[facility] Gastroenterology Patient Name: Luke Cain Procedure Date: 01/15/2017 9:01 AM MRN: [id number] Account #: [id number] Date of Birth: 21-Oct-1950 Admit Type: Outpatient Age: 66 Room: [facility] OR ROOM 01 Gender: Male Note Status: Finalized Procedure:            Colonoscopy Indications:          Screening for colorectal malignant neoplasm Providers:            Lucilla Lame MD, MD Referring MD:         Guadalupe Maple, MD (Referring MD) Medicines:            Propofol per Anesthesia Complications:        No immediate complications. Procedure:            Pre-Anesthesia Assessment:                       - Prior to the procedure, a History and Physical was                        performed, and patient medications and allergies were                        reviewed. The patient's tolerance of previous                        anesthesia was also reviewed. The risks and benefits of                        the procedure and the sedation options and risks were                        discussed with the patient. All questions were                        answered, and informed consent was obtained. Prior                        Anticoagulants: The patient has taken no previous                        anticoagulant or antiplatelet agents. ASA Grade                        Assessment: II - A patient with mild systemic disease.                        After reviewing the risks and benefits, the patient was                        deemed in satisfactory condition to undergo the                        procedure.                       After obtaining informed consent, the colonoscope was                        passed under direct vision. Throughout the procedure,  the patient's blood pressure, pulse, and oxygen                        saturations were monitored continuously. The Olympus CF                        H180AL Colonoscope (S#: [id number]) was introduced  through                        the anus and advanced to the the cecum, identified by                        appendiceal orifice and ileocecal valve. The                        colonoscopy was performed without difficulty. The                        patient tolerated the procedure well. The quality of                        the bowel preparation was excellent. Findings:      The perianal and digital rectal examinations were normal.      Two sessile polyps were found in the rectum. The polyps were 2 to 3 mm       in size. These polyps were removed with a cold biopsy forceps. Resection       and retrieval were complete.      Non-bleeding internal hemorrhoids were found during retroflexion. The       hemorrhoids were Grade I (internal hemorrhoids that do not prolapse). Impression:           - Two 2 to 3 mm polyps in the rectum, removed with a                        cold biopsy forceps. Resected and retrieved.                       - Non-bleeding internal hemorrhoids. Recommendation:       - Discharge patient to home.                       - Resume previous diet.                       - Continue present medications.                       - Await pathology results.                       - Repeat colonoscopy in 5 years if polyp adenoma and 10                        years if hyperplastic Procedure Code(s):    --- Professional ---                       [phone], Colonoscopy, flexible; with biopsy, single or                        multiple Diagnosis Code(s):    ---  Professional ---                       Z12.11, Encounter for screening for malignant neoplasm                        of colon                       K62.1, Rectal polyp CPT copyright 2016 American Medical Association. All rights reserved. The codes documented in this report are preliminary and upon coder review may  be revised to meet current compliance requirements. Lucilla Lame MD, MD 01/15/2017 9:28:55 AM This report has been signed  electronically. Number of Addenda: 0 Note Initiated On: 01/15/2017 9:01 AM Scope Withdrawal Time: 0 hours 8 minutes 47 seconds  Total Procedure Duration: 0 hours 12 minutes 0 seconds       [facility]

## 2017-01-15 NOTE — Anesthesia Procedure Notes (Signed)
Procedure Name: MAC Performed by: Charla Criscione Pre-anesthesia Checklist: Patient identified, Emergency Drugs available, Suction available, Timeout performed and Patient being monitored Patient Re-evaluated:Patient Re-evaluated prior to inductionOxygen Delivery Method: Nasal cannula Placement Confirmation: positive ETCO2     

## 2017-01-15 NOTE — Anesthesia Postprocedure Evaluation (Signed)
Anesthesia Post Note  Patient: Luke Cain  Procedure(s) Performed: Procedure(s) (LRB): COLONOSCOPY WITH PROPOFOL (N/A) POLYPECTOMY  Patient location during evaluation: PACU Anesthesia Type: MAC Level of consciousness: awake and alert Pain management: pain level controlled Vital Signs Assessment: post-procedure vital signs reviewed and stable Respiratory status: spontaneous breathing, nonlabored ventilation and respiratory function stable Cardiovascular status: stable Postop Assessment: no signs of nausea or vomiting Anesthetic complications: no    Veda Canning

## 2017-01-15 NOTE — H&P (Signed)
   Lucilla Lame, MD [address] [place], [place] [address] Phone: [phone] Fax : [phone]  Primary Care Physician:  Golden Pop, MD Primary Gastroenterologist:  Dr. Allen Norris  Pre-Procedure History & Physical: HPI:  Luke Cain is a 66 y.o. male is here for a screening colonoscopy.   Past Medical History:  Diagnosis Date  . Arthritis    hands, knees  . GERD (gastroesophageal reflux disease)   . Sleep apnea     Past Surgical History:  Procedure Laterality Date  . ANAL FISSURE REPAIR     over 30 yrs ago    Prior to Admission medications   Medication Sig Start Date End Date Taking? Authorizing Provider  alprostadil (EDEX) 40 MCG injection 40 mcg by Intracavitary route as needed for erectile dysfunction. use no more than 3 times per week   Yes Historical Provider, MD  aspirin EC 81 MG tablet Take by mouth.   Yes Historical Provider, MD  loratadine (CLARITIN) 10 MG tablet Take 10 mg by mouth daily.   Yes Historical Provider, MD  Multiple Vitamin (MULTI-VITAMINS) TABS Take by mouth.   Yes Historical Provider, MD  Na Sulfate-K Sulfate-Mg Sulf (SUPREP BOWEL PREP KIT) 17.5-3.13-1.6 GM/180ML SOLN Take 1 kit by mouth as directed. 01/11/17  Yes Lucilla Lame, MD  Turmeric, Lear Ng, ([place]) POWD by Does not apply route daily.   Yes Historical Provider, MD  vitamin B-12 (CYANOCOBALAMIN) 1000 MCG tablet Take 5,000 mcg by mouth.    Yes Historical Provider, MD    Allergies as of 01/05/2017  . (No Known Allergies)    Family History  Problem Relation Age of Onset  . Thyroid disease Mother   . Varicose Veins Mother   . Congestive Heart Failure Mother   . Diabetes Father   . Heart disease Father   . Heart attack Father     Social History   Social History  . Marital status: Married    Spouse name: N/A  . Number of children: N/A  . Years of education: N/A   Occupational History  . Not on file.   Social History Main Topics  . Smoking status: Former  Smoker    Quit date: 04/14/1999  . Smokeless tobacco: Former Systems developer    Quit date: 04/14/1999  . Alcohol use 2.4 oz/week    4 Glasses of wine per week  . Drug use: No  . Sexual activity: Not on file   Other Topics Concern  . Not on file   Social History Narrative  . No narrative on file    Review of Systems: See HPI, otherwise negative ROS  Physical Exam: BP 119/82   Pulse 66   Temp 98.1 F (36.7 C) (Temporal)   Resp 16   Ht 5' 11.65" (1.82 m)   Wt 224 lb (101.6 kg)   SpO2 97%   BMI 30.68 kg/m  General:   Alert,  pleasant and cooperative in NAD Head:  Normocephalic and atraumatic. Neck:  Supple; no masses or thyromegaly. Lungs:  Clear throughout to auscultation.    Heart:  Regular rate and rhythm. Abdomen:  Soft, nontender and nondistended. Normal bowel sounds, without guarding, and without rebound.   Neurologic:  Alert and  oriented x4;  grossly normal neurologically.  Impression/Plan: Luke Cain is now here to undergo a screening colonoscopy.  Risks, benefits, and alternatives regarding colonoscopy have been reviewed with the patient.  Questions have been answered.  All parties agreeable.

## 2017-01-15 NOTE — Anesthesia Preprocedure Evaluation (Signed)
Anesthesia Evaluation  Patient identified by MRN, date of birth, ID band Patient awake    Reviewed: Allergy & Precautions  Airway Mallampati: II  TM Distance: >3 FB     Dental  (+) Teeth Intact   Pulmonary sleep apnea , former smoker,    breath sounds clear to auscultation       Cardiovascular  Rhythm:regular Rate:Normal     Neuro/Psych    GI/Hepatic GERD  ,  Endo/Other    Renal/GU      Musculoskeletal   Abdominal   Peds  Hematology   Anesthesia Other Findings   Reproductive/Obstetrics                            Anesthesia Physical Anesthesia Plan  ASA: II  Anesthesia Plan: MAC   Post-op Pain Management:    Induction:   Airway Management Planned:   Additional Equipment:   Intra-op Plan:   Post-operative Plan:   Informed Consent: I have reviewed the patients History and Physical, chart, labs and discussed the procedure including the risks, benefits and alternatives for the proposed anesthesia with the patient or authorized representative who has indicated his/her understanding and acceptance.     Plan Discussed with: CRNA  Anesthesia Plan Comments:         Anesthesia Quick Evaluation

## 2017-01-15 NOTE — Transfer of Care (Signed)
Immediate Anesthesia Transfer of Care Note  Patient: Luke Cain  Procedure(s) Performed: Procedure(s) with comments: COLONOSCOPY WITH PROPOFOL (N/A) - sleep apnea POLYPECTOMY  Patient Location: PACU  Anesthesia Type: MAC  Level of Consciousness: awake, alert  and patient cooperative  Airway and Oxygen Therapy: Patient Spontanous Breathing and Patient connected to supplemental oxygen  Post-op Assessment: Post-op Vital signs reviewed, Patient's Cardiovascular Status Stable, Respiratory Function Stable, Patent Airway and No signs of Nausea or vomiting  Post-op Vital Signs: Reviewed and stable  Complications: No apparent anesthesia complications

## 2017-01-16 ENCOUNTER — Encounter: Payer: Self-pay | Admitting: Gastroenterology

## 2017-01-18 ENCOUNTER — Encounter: Payer: Self-pay | Admitting: Gastroenterology

## 2017-01-22 ENCOUNTER — Other Ambulatory Visit: Payer: Self-pay

## 2017-07-05 ENCOUNTER — Ambulatory Visit: Payer: 59 | Admitting: Family Medicine

## 2017-07-09 ENCOUNTER — Encounter: Payer: Self-pay | Admitting: Family Medicine

## 2017-07-09 ENCOUNTER — Ambulatory Visit (INDEPENDENT_AMBULATORY_CARE_PROVIDER_SITE_OTHER): Payer: Medicare Other | Admitting: Family Medicine

## 2017-07-09 VITALS — BP 135/81 | HR 67 | Wt 238.0 lb

## 2017-07-09 DIAGNOSIS — R7989 Other specified abnormal findings of blood chemistry: Secondary | ICD-10-CM | POA: Insufficient documentation

## 2017-07-09 DIAGNOSIS — R7309 Other abnormal glucose: Secondary | ICD-10-CM | POA: Diagnosis not present

## 2017-07-09 NOTE — Assessment & Plan Note (Signed)
ith better diet and dietary measures last A1c was normal. In the non-prediabetes range. Checking today to see what it is now.

## 2017-07-09 NOTE — Progress Notes (Signed)
BP 135/81 (BP Location: Right Arm)   Pulse 67   Wt 238 lb (108 kg)   SpO2 98%   BMI 32.59 kg/m    Subjective:    Patient ID: Luke Cain, male    DOB: 10/16/1950, 66 y.o.   MRN: [id number]  HPI: Luke Cain is a 66 y.o. male  Chief Complaint  Patient presents with  . Follow-up  patient follow-up CPAP machine does not record data needs data recording machine will get a new machine gave prescription. Uses CPAP every night. With no issues of drowsiness during the daytime. Patient also getting testosterone replacement therapy through a clinic in [place]. Just got tested pill implants. Patient also with concern about prediabetes had been in the normal range last hemoglobin A1c will check again today.  Relevant past medical, surgical, family and social history reviewed and updated as indicated. Interim medical history since our last visit reviewed. Allergies and medications reviewed and updated.  Review of Systems  Constitutional: Negative.   Respiratory: Negative.   Cardiovascular: Negative.     Per HPI unless specifically indicated above     Objective:    BP 135/81 (BP Location: Right Arm)   Pulse 67   Wt 238 lb (108 kg)   SpO2 98%   BMI 32.59 kg/m   Wt Readings from Last 3 Encounters:  07/09/17 238 lb (108 kg)  01/15/17 224 lb (101.6 kg)  12/27/16 231 lb (104.8 kg)    Physical Exam  Constitutional: He is oriented to person, place, and time. He appears well-developed and well-nourished.  HENT:  Head: Normocephalic and atraumatic.  Eyes: Conjunctivae and EOM are normal.  Neck: Normal range of motion.  Cardiovascular: Normal rate, regular rhythm and normal heart sounds.   Pulmonary/Chest: Effort normal and breath sounds normal.  Musculoskeletal: Normal range of motion.  Neurological: He is alert and oriented to person, place, and time.  Skin: No erythema.  Psychiatric: He has a normal mood and affect. His behavior is normal. Judgment and thought content  normal.    Results for orders placed or performed in visit on 12/27/16  Microscopic Examination  Result Value Ref Range   WBC, UA None seen 0 - 5 /hpf   RBC, UA None seen 0 - 2 /hpf   Epithelial Cells (non renal) CANCELED    Bacteria, UA None seen None seen/Few  CBC with Differential/Platelet  Result Value Ref Range   WBC 5.4 3.4 - 10.8 x10E3/uL   RBC 4.58 4.14 - 5.80 x10E6/uL   Hemoglobin 14.8 13.0 - 17.7 g/dL   Hematocrit 43.2 37.5 - 51.0 %   MCV 94 79 - 97 fL   MCH 32.3 26.6 - 33.0 pg   MCHC 34.3 31.5 - 35.7 g/dL   RDW 13.5 12.3 - 15.4 %   Platelets 137 (L) 150 - 379 x10E3/uL   Neutrophils 61 Not Estab. %   Lymphs 30 Not Estab. %   Monocytes 7 Not Estab. %   Eos 2 Not Estab. %   Basos 0 Not Estab. %   Neutrophils Absolute 3.3 1.4 - 7.0 x10E3/uL   Lymphocytes Absolute 1.6 0.7 - 3.1 x10E3/uL   Monocytes Absolute 0.4 0.1 - 0.9 x10E3/uL   EOS (ABSOLUTE) 0.1 0.0 - 0.4 x10E3/uL   Basophils Absolute 0.0 0.0 - 0.2 x10E3/uL   Immature Granulocytes 0 Not Estab. %   Immature Grans (Abs) 0.0 0.0 - 0.1 x10E3/uL  Comprehensive metabolic panel  Result Value Ref Range   Glucose  98 65 - 99 mg/dL   BUN 15 8 - 27 mg/dL   Creatinine, Ser 1.13 0.76 - 1.27 mg/dL   GFR calc non Af Amer 68 >59 mL/min/1.73   GFR calc Af Amer 79 >59 mL/min/1.73   BUN/Creatinine Ratio 13 10 - 24   Sodium 140 134 - 144 mmol/L   Potassium 3.8 3.5 - 5.2 mmol/L   Chloride 102 96 - 106 mmol/L   CO2 20 18 - 29 mmol/L   Calcium 9.6 8.6 - 10.2 mg/dL   Total Protein 7.5 6.0 - 8.5 g/dL   Albumin 4.8 3.6 - 4.8 g/dL   Globulin, Total 2.7 1.5 - 4.5 g/dL   Albumin/Globulin Ratio 1.8 1.2 - 2.2   Bilirubin Total 0.6 0.0 - 1.2 mg/dL   Alkaline Phosphatase 56 39 - 117 IU/L   AST 29 0 - 40 IU/L   ALT 41 0 - 44 IU/L  Lipid panel  Result Value Ref Range   Cholesterol, Total 137 100 - 199 mg/dL   Triglycerides 94 0 - 149 mg/dL   HDL 35 (L) >39 mg/dL   VLDL Cholesterol Cal 19 5 - 40 mg/dL   LDL Calculated 83 0 - 99  mg/dL   Chol/HDL Ratio 3.9 0.0 - 5.0 ratio  PSA  Result Value Ref Range   Prostate Specific Ag, Serum 0.3 0.0 - 4.0 ng/mL  TSH  Result Value Ref Range   TSH 2.100 0.450 - 4.500 uIU/mL  Urinalysis, Routine w reflex microscopic  Result Value Ref Range   Specific Gravity, UA <1.005 (L) 1.005 - 1.030   pH, UA 6.0 5.0 - 7.5   Color, UA Yellow Yellow   Appearance Ur Clear Clear   Leukocytes, UA Negative Negative   Protein, UA Negative Negative/Trace   Glucose, UA Negative Negative   Ketones, UA Negative Negative   RBC, UA Negative Negative   Bilirubin, UA Negative Negative   Urobilinogen, Ur 0.2 0.2 - 1.0 mg/dL   Nitrite, UA Negative Negative   Microscopic Examination See below:   Hgb A1c w/o eAG  Result Value Ref Range   Hgb A1c MFr Bld 5.5 4.8 - 5.6 %      Assessment & Plan:   Problem List Items Addressed This Visit      Other   Elevated glucose - Primary    ith better diet and dietary measures last A1c was normal. In the non-prediabetes range. Checking today to see what it is now.      Relevant Orders   Bayer [place] Hb A1c Waived   Low testosterone    Patient started testosterone replacement therapy management clinic in [place]          Follow up plan: Return in about 6 months (around 01/07/2018), or  testosterone, for Hemoglobin A1c, Physical Exam.

## 2017-07-09 NOTE — Assessment & Plan Note (Signed)
Patient started testosterone replacement therapy management clinic in [place]

## 2017-07-11 ENCOUNTER — Other Ambulatory Visit: Payer: 59

## 2017-07-11 DIAGNOSIS — R7309 Other abnormal glucose: Secondary | ICD-10-CM

## 2017-07-11 LAB — BAYER DCA HB A1C WAIVED: HB A1C: 5.2 % (ref <7.0)

## 2017-11-07 ENCOUNTER — Encounter: Payer: Self-pay | Admitting: Family Medicine

## 2018-01-10 ENCOUNTER — Encounter: Payer: 59 | Admitting: Family Medicine

## 2018-02-21 ENCOUNTER — Encounter: Payer: 59 | Admitting: Family Medicine

## 2018-02-25 ENCOUNTER — Ambulatory Visit (INDEPENDENT_AMBULATORY_CARE_PROVIDER_SITE_OTHER): Payer: 59 | Admitting: Family Medicine

## 2018-02-25 ENCOUNTER — Encounter: Payer: Self-pay | Admitting: Family Medicine

## 2018-02-25 VITALS — BP 130/89 | HR 75 | Ht 71.26 in | Wt 231.0 lb

## 2018-02-25 DIAGNOSIS — R7989 Other specified abnormal findings of blood chemistry: Secondary | ICD-10-CM

## 2018-02-25 DIAGNOSIS — Z13220 Encounter for screening for lipoid disorders: Secondary | ICD-10-CM | POA: Diagnosis not present

## 2018-02-25 DIAGNOSIS — Z114 Encounter for screening for human immunodeficiency virus [HIV]: Secondary | ICD-10-CM

## 2018-02-25 DIAGNOSIS — N401 Enlarged prostate with lower urinary tract symptoms: Secondary | ICD-10-CM | POA: Diagnosis not present

## 2018-02-25 DIAGNOSIS — R351 Nocturia: Secondary | ICD-10-CM

## 2018-02-25 DIAGNOSIS — R7309 Other abnormal glucose: Secondary | ICD-10-CM

## 2018-02-25 DIAGNOSIS — N529 Male erectile dysfunction, unspecified: Secondary | ICD-10-CM | POA: Diagnosis not present

## 2018-02-25 DIAGNOSIS — Z1159 Encounter for screening for other viral diseases: Secondary | ICD-10-CM | POA: Diagnosis not present

## 2018-02-25 DIAGNOSIS — Z8249 Family history of ischemic heart disease and other diseases of the circulatory system: Secondary | ICD-10-CM | POA: Diagnosis not present

## 2018-02-25 DIAGNOSIS — Z0000 Encounter for general adult medical examination without abnormal findings: Secondary | ICD-10-CM

## 2018-02-25 LAB — URINALYSIS, ROUTINE W REFLEX MICROSCOPIC
Bilirubin, UA: NEGATIVE
Glucose, UA: NEGATIVE
Leukocytes, UA: NEGATIVE
NITRITE UA: NEGATIVE
Protein, UA: NEGATIVE
RBC, UA: NEGATIVE
Specific Gravity, UA: 1.01 (ref 1.005–1.030)
UUROB: 0.2 mg/dL (ref 0.2–1.0)
pH, UA: 5 (ref 5.0–7.5)

## 2018-02-25 LAB — BAYER DCA HB A1C WAIVED: HB A1C (BAYER DCA - WAIVED): 5.6 % (ref <7.0)

## 2018-02-25 NOTE — Assessment & Plan Note (Signed)
Schedule ultrasound

## 2018-02-25 NOTE — Assessment & Plan Note (Signed)
Followed by urology.   

## 2018-02-25 NOTE — Progress Notes (Signed)
BP 130/89   Pulse 75   Ht 5' 11.26" (1.81 m)   Wt 231 lb (104.8 kg)   SpO2 98%   BMI 31.98 kg/m    Subjective:    Patient ID: Luke Cain, male    DOB: 1950/12/19, 67 y.o.   MRN: [id number]  HPI: Luke Cain is a 67 y.o. male  Chief Complaint  Patient presents with  . Annual Exam   Patient all in all doing well great deal of stress with life and work but otherwise doing well.  Doing testosterone replacement which is working well having routine blood work.  Also working with urology.  Had normal prostate check. Otherwise no complaints. Also discussed support hose  Also gets short of breath at exertion levels above 135 bpm.  Is able to go to long-term at lower levels but if heart rate gets above 135 becomes anaerobic and has to slow down and stop.  This has not improved over the last several years.  Does see a cardiologist on a regular basis with no untoward problems has had both stress test and echocardiograms which are normal.  Family history of abdominal aortic aneurysm and wondering about aneurysm checks will set up for ultrasound of his aorta.  Relevant past medical, surgical, family and social history reviewed and updated as indicated. Interim medical history since our last visit reviewed. Allergies and medications reviewed and updated.  Review of Systems  Constitutional: Negative.   HENT: Negative.   Eyes: Negative.   Respiratory: Negative.   Cardiovascular: Negative.   Gastrointestinal: Negative.   Endocrine: Negative.   Genitourinary: Negative.   Musculoskeletal: Negative.   Skin: Negative.   Allergic/Immunologic: Negative.   Neurological: Negative.   Hematological: Negative.   Psychiatric/Behavioral: Negative.     Per HPI unless specifically indicated above     Objective:    BP 130/89   Pulse 75   Ht 5' 11.26" (1.81 m)   Wt 231 lb (104.8 kg)   SpO2 98%   BMI 31.98 kg/m   Wt Readings from Last 3 Encounters:  02/25/18 231 lb (104.8 kg)    07/09/17 238 lb (108 kg)  01/15/17 224 lb (101.6 kg)    Physical Exam  Constitutional: He is oriented to person, place, and time. He appears well-developed and well-nourished.  HENT:  Head: Normocephalic.  Right Ear: External ear normal.  Left Ear: External ear normal.  Nose: Nose normal.  Eyes: Pupils are equal, round, and reactive to light. Conjunctivae and EOM are normal.  Neck: Normal range of motion. Neck supple. No thyromegaly present.  Cardiovascular: Normal rate, regular rhythm, normal heart sounds and intact distal pulses.  Pulmonary/Chest: Effort normal and breath sounds normal.  Abdominal: Soft. Bowel sounds are normal. There is no splenomegaly or hepatomegaly.  Genitourinary: Penis normal.  Musculoskeletal: Normal range of motion.  Lymphadenopathy:    He has no cervical adenopathy.  Neurological: He is alert and oriented to person, place, and time. He has normal reflexes.  Skin: Skin is warm and dry.  Psychiatric: He has a normal mood and affect. His behavior is normal. Judgment and thought content normal.    Results for orders placed or performed in visit on 07/11/17  Bayer DCA Hb A1c Waived  Result Value Ref Range   HB A1C (BAYER DCA - WAIVED) 5.2 <7.0 %      Assessment & Plan:   Problem List Items Addressed This Visit      Genitourinary   BPH (benign  prostatic hyperplasia) - Primary    Followed by urology      Relevant Orders   PSA   Impotence due to erectile dysfunction    Followed by urology      Relevant Orders   Testosterone, free, total([facility])     Other   Elevated glucose    Normal A1C      Relevant Orders   Bayer DCA Hb A1c Waived   Comprehensive metabolic panel   Urinalysis, Routine w reflex microscopic   Low testosterone    Followed by urology      Relevant Orders   Testosterone, free, total([facility])   PSA   Family history of abdominal aortic aneurysm (AAA) repair    Schedule ultrasound      Relevant  Orders   VAS [place] AAA DUPLEX    Other Visit Diagnoses    Annual physical exam       Relevant Orders   Bayer DCA Hb A1c Waived   Testosterone, free, total([facility])   CBC with Differential/Platelet   Comprehensive metabolic panel   Lipid panel   PSA   TSH   Urinalysis, Routine w reflex microscopic   Screening cholesterol level       Relevant Orders   Lipid panel   Encounter for screening for HIV       Relevant Orders   HIV antibody   Need for hepatitis C screening test       Relevant Orders   Hepatitis C antibody       Follow up plan: Return in about 1 year (around 02/26/2019) for Physical Exam.

## 2018-02-25 NOTE — Assessment & Plan Note (Signed)
Normal A1C

## 2018-02-26 ENCOUNTER — Encounter: Payer: Self-pay | Admitting: Family Medicine

## 2018-02-26 LAB — CBC WITH DIFFERENTIAL/PLATELET
BASOS ABS: 0 10*3/uL (ref 0.0–0.2)
Basos: 0 %
EOS (ABSOLUTE): 0.1 10*3/uL (ref 0.0–0.4)
Eos: 1 %
Hematocrit: 47.8 % (ref 37.5–51.0)
Hemoglobin: 16.5 g/dL (ref 13.0–17.7)
IMMATURE GRANULOCYTES: 0 %
Immature Grans (Abs): 0 10*3/uL (ref 0.0–0.1)
LYMPHS ABS: 1.6 10*3/uL (ref 0.7–3.1)
Lymphs: 27 %
MCH: 32.4 pg (ref 26.6–33.0)
MCHC: 34.5 g/dL (ref 31.5–35.7)
MCV: 94 fL (ref 79–97)
MONOCYTES: 6 %
MONOS ABS: 0.4 10*3/uL (ref 0.1–0.9)
NEUTROS PCT: 66 %
Neutrophils Absolute: 4 10*3/uL (ref 1.4–7.0)
Platelets: 137 10*3/uL — ABNORMAL LOW (ref 150–450)
RBC: 5.09 x10E6/uL (ref 4.14–5.80)
RDW: 13.7 % (ref 12.3–15.4)
WBC: 6.1 10*3/uL (ref 3.4–10.8)

## 2018-02-26 LAB — LIPID PANEL
Chol/HDL Ratio: 6 ratio — ABNORMAL HIGH (ref 0.0–5.0)
Cholesterol, Total: 173 mg/dL (ref 100–199)
HDL: 29 mg/dL — AB (ref >39)
LDL CALC: 105 mg/dL — AB (ref 0–99)
Triglycerides: 194 mg/dL — ABNORMAL HIGH (ref 0–149)
VLDL Cholesterol Cal: 39 mg/dL (ref 5–40)

## 2018-02-26 LAB — TESTOSTERONE, FREE, TOTAL, SHBG
SEX HORMONE BINDING: 44.4 nmol/L (ref 19.3–76.4)
TESTOSTERONE: 1275 ng/dL — AB (ref 264–916)
Testosterone, Free: 28.4 pg/mL — ABNORMAL HIGH (ref 6.6–18.1)

## 2018-02-26 LAB — HIV ANTIBODY (ROUTINE TESTING W REFLEX): HIV SCREEN 4TH GENERATION: NONREACTIVE

## 2018-02-26 LAB — COMPREHENSIVE METABOLIC PANEL
ALK PHOS: 49 IU/L (ref 39–117)
ALT: 44 IU/L (ref 0–44)
AST: 30 IU/L (ref 0–40)
Albumin/Globulin Ratio: 1.5 (ref 1.2–2.2)
Albumin: 4.8 g/dL (ref 3.6–4.8)
BUN/Creatinine Ratio: 17 (ref 10–24)
BUN: 20 mg/dL (ref 8–27)
Bilirubin Total: 0.4 mg/dL (ref 0.0–1.2)
CALCIUM: 10.1 mg/dL (ref 8.6–10.2)
CO2: 21 mmol/L (ref 20–29)
CREATININE: 1.18 mg/dL (ref 0.76–1.27)
Chloride: 101 mmol/L (ref 96–106)
GFR calc Af Amer: 74 mL/min/{1.73_m2} (ref >59)
GFR, EST NON AFRICAN AMERICAN: 64 mL/min/{1.73_m2} (ref >59)
Globulin, Total: 3.2 g/dL (ref 1.5–4.5)
Glucose: 112 mg/dL — ABNORMAL HIGH (ref 65–99)
Potassium: 4.5 mmol/L (ref 3.5–5.2)
Sodium: 140 mmol/L (ref 134–144)
Total Protein: 8 g/dL (ref 6.0–8.5)

## 2018-02-26 LAB — TSH: TSH: 2.13 u[IU]/mL (ref 0.450–4.500)

## 2018-02-26 LAB — HEPATITIS C ANTIBODY: Hep C Virus Ab: <0.1 s/co ratio (ref 0.0–0.9)

## 2018-02-26 LAB — PSA: PROSTATE SPECIFIC AG, SERUM: 0.5 ng/mL (ref 0.0–4.0)

## 2018-05-30 ENCOUNTER — Other Ambulatory Visit: Payer: Self-pay | Admitting: Family Medicine

## 2018-05-30 ENCOUNTER — Encounter: Payer: Self-pay | Admitting: Family Medicine

## 2018-05-30 ENCOUNTER — Ambulatory Visit (INDEPENDENT_AMBULATORY_CARE_PROVIDER_SITE_OTHER): Payer: 59

## 2018-05-30 DIAGNOSIS — Z87891 Personal history of nicotine dependence: Secondary | ICD-10-CM

## 2018-05-30 DIAGNOSIS — Z8249 Family history of ischemic heart disease and other diseases of the circulatory system: Secondary | ICD-10-CM

## 2018-08-12 ENCOUNTER — Ambulatory Visit: Payer: Self-pay

## 2018-08-12 NOTE — Telephone Encounter (Signed)
Patient was transferred to provider for telephone conversation.   

## 2018-08-12 NOTE — Telephone Encounter (Signed)
Call pt 

## 2018-08-12 NOTE — Telephone Encounter (Addendum)
Pt. Injured left shoulder lifting weights and is requesting a cortisone shot. Only wants to talk to Dr. Jeananne Rama.  Declined an appointment.Please advise pt. Left message that triage would pass this message on to provider. Thanks.

## 2018-12-02 ENCOUNTER — Other Ambulatory Visit: Payer: Self-pay

## 2018-12-02 ENCOUNTER — Encounter: Payer: Self-pay | Admitting: Nurse Practitioner

## 2018-12-02 ENCOUNTER — Ambulatory Visit: Payer: 59 | Admitting: Nurse Practitioner

## 2018-12-02 VITALS — BP 140/82 | HR 79 | Temp 97.3°F | Ht 71.2 in | Wt 238.0 lb

## 2018-12-02 DIAGNOSIS — J0110 Acute frontal sinusitis, unspecified: Secondary | ICD-10-CM | POA: Diagnosis not present

## 2018-12-02 DIAGNOSIS — J029 Acute pharyngitis, unspecified: Secondary | ICD-10-CM | POA: Insufficient documentation

## 2018-12-02 DIAGNOSIS — J0100 Acute maxillary sinusitis, unspecified: Secondary | ICD-10-CM | POA: Insufficient documentation

## 2018-12-02 MED ORDER — AMOXICILLIN-POT CLAVULANATE 875-125 MG PO TABS: 1.0000 | ORAL_TABLET | Freq: Two times a day (BID) | ORAL | 0 refills | Status: AC

## 2018-12-02 MED ORDER — BENZONATATE 200 MG PO CAPS: 200.0000 mg | ORAL_CAPSULE | Freq: Two times a day (BID) | ORAL | 0 refills | Status: DC | PRN

## 2018-12-02 NOTE — Patient Instructions (Signed)

## 2018-12-02 NOTE — Assessment & Plan Note (Signed)
Acute with no improvement x 3 weeks.  Negative flu and strep testing. Scripts for Augmentin and Tessalon sent.  Recommend use of Coricidin.  Avoid Sudafed, Ibuprofen, and Benadryl.  May use Claritin for nasal drainage as needed.  Sinus rinses at home and humidifier.  Return for worsening or continued symptoms.

## 2018-12-02 NOTE — Progress Notes (Signed)
BP 140/82 (BP Location: Left Arm, Patient Position: Sitting)   Pulse 79   Temp (!) 97.3 F (36.3 C) (Oral)   Ht 5' 11.2" (1.808 m)   Wt 238 lb (108 kg)   SpO2 95%   BMI 33.01 kg/m    Subjective:    Patient ID: Luke Cain, male    DOB: [birth year], 68 y.o.   MRN: [id number]  HPI: Luke Cain is a 68 y.o. male  Chief Complaint  Patient presents with  . Sore Throat    x 3 weeks. got worse this past week. tried OTC sudafed, benadryl. spitting up yellowish mucus  . Cough  . Headache  . Nasal Congestion   UPPER RESPIRATORY TRACT INFECTION Has had symptoms x 3 weeks, with cough just starting last week.  States "moving into my chest this weekend".   Worst symptom: stuffy head Fever: no Cough: yes Shortness of breath: no Wheezing: no Chest pain: no Chest tightness: yes Chest congestion: yes Nasal congestion: yes Runny nose: yes Post nasal drip: yes Sneezing: yes Sore throat: yes Swollen glands: yes Sinus pressure: yes Headache: yes Face pain: yes Toothache: no Ear pain: none Ear pressure: yes bilateral Eyes red/itching:no Eye drainage/crusting: no  Vomiting: no Rash: no Fatigue: yes Sick contacts: yes, brother and coworkers Strep contacts: no  Context: fluctuating Recurrent sinusitis: no Relief with OTC cold/cough medications: no  Treatments attempted: cold/sinus and anti-histamine   Relevant past medical, surgical, family and social history reviewed and updated as indicated. Interim medical history since our last visit reviewed. Allergies and medications reviewed and updated.  Review of Systems  Constitutional: Positive for fatigue. Negative for activity change, diaphoresis and fever.  HENT: Positive for congestion, postnasal drip, rhinorrhea, sinus pressure, sneezing and sore throat. Negative for ear discharge, ear pain, nosebleeds, sinus pain and voice change.   Eyes: Negative for pain and visual disturbance.  Respiratory: Positive for cough.  Negative for chest tightness, shortness of breath and wheezing.   Cardiovascular: Negative for chest pain, palpitations and leg swelling.  Gastrointestinal: Negative for abdominal distention, abdominal pain, constipation, diarrhea, nausea and vomiting.  Endocrine: Negative for cold intolerance, heat intolerance, polydipsia, polyphagia and polyuria.  Musculoskeletal: Negative.   Skin: Negative.   Neurological: Negative for dizziness, syncope, weakness, light-headedness, numbness and headaches.  Psychiatric/Behavioral: Negative.     Per HPI unless specifically indicated above     Objective:    BP 140/82 (BP Location: Left Arm, Patient Position: Sitting)   Pulse 79   Temp (!) 97.3 F (36.3 C) (Oral)   Ht 5' 11.2" (1.808 m)   Wt 238 lb (108 kg)   SpO2 95%   BMI 33.01 kg/m   Wt Readings from Last 3 Encounters:  12/02/18 238 lb (108 kg)  02/25/18 231 lb (104.8 kg)  07/09/17 238 lb (108 kg)    Physical Exam Vitals signs and nursing note reviewed.  Constitutional:      General: He is awake.     Appearance: He is well-developed. He is ill-appearing.  HENT:     Head: Normocephalic and atraumatic.     Right Ear: Hearing, ear canal and external ear normal. No drainage. A middle ear effusion is present.     Left Ear: Hearing, ear canal and external ear normal. No drainage. A middle ear effusion is present.     Nose: Mucosal edema and rhinorrhea present. Rhinorrhea is purulent.     Right Sinus: Frontal sinus tenderness present. No maxillary sinus tenderness.  Left Sinus: Frontal sinus tenderness present. No maxillary sinus tenderness.     Mouth/Throat:     Mouth: Mucous membranes are moist.     Pharynx: Uvula midline. Posterior oropharyngeal erythema (mild with cobblestoning) present. No pharyngeal swelling.     Tonsils: Swelling: 0 on the right. 0 on the left.  Eyes:     General: Lids are normal.        Right eye: No discharge.        Left eye: No discharge.      Conjunctiva/sclera: Conjunctivae normal.     Pupils: Pupils are equal, round, and reactive to light.  Neck:     Musculoskeletal: Normal range of motion and neck supple.     Thyroid: No thyromegaly.     Vascular: No carotid bruit or JVD.     Trachea: Trachea normal.  Cardiovascular:     Rate and Rhythm: Normal rate and regular rhythm.     Heart sounds: Normal heart sounds, S1 normal and S2 normal. No murmur. No gallop.   Pulmonary:     Effort: Pulmonary effort is normal.     Breath sounds: Normal breath sounds.     Comments: Clear throughout without adventitious sounds.  Abdominal:     General: Bowel sounds are normal.     Palpations: Abdomen is soft. There is no hepatomegaly or splenomegaly.  Musculoskeletal: Normal range of motion.     Right lower leg: No edema.     Left lower leg: No edema.  Lymphadenopathy:     Head:     Right side of head: Tonsillar adenopathy present. No submental, submandibular, preauricular or posterior auricular adenopathy.     Left side of head: Tonsillar adenopathy present. No submental, submandibular, preauricular or posterior auricular adenopathy.     Cervical: No cervical adenopathy.  Skin:    General: Skin is warm and dry.     Capillary Refill: Capillary refill takes less than 2 seconds.     Findings: No rash.  Neurological:     Mental Status: He is alert and oriented to person, place, and time.     Deep Tendon Reflexes: Reflexes are normal and symmetric.  Psychiatric:        Attention and Perception: Attention normal.        Mood and Affect: Mood normal.        Behavior: Behavior normal. Behavior is cooperative.        Thought Content: Thought content normal.        Judgment: Judgment normal.     Results for orders placed or performed in visit on 02/25/18  Bayer DCA Hb A1c Waived  Result Value Ref Range   HB A1C (BAYER DCA - WAIVED) 5.6 <7.0 %  Testosterone, free, total([facility])  Result Value Ref Range   Testosterone 1,275 (H)  264 - 916 ng/dL   Testosterone, Free 28.4 (H) 6.6 - 18.1 pg/mL   Sex Hormone Binding 44.4 19.3 - 76.4 nmol/L  CBC with Differential/Platelet  Result Value Ref Range   WBC 6.1 3.4 - 10.8 x10E3/uL   RBC 5.09 4.14 - 5.80 x10E6/uL   Hemoglobin 16.5 13.0 - 17.7 g/dL   Hematocrit 47.8 37.5 - 51.0 %   MCV 94 79 - 97 fL   MCH 32.4 26.6 - 33.0 pg   MCHC 34.5 31.5 - 35.7 g/dL   RDW 13.7 12.3 - 15.4 %   Platelets 137 (L) 150 - 450 x10E3/uL   Neutrophils 66 Not Estab. %   Lymphs  27 Not Estab. %   Monocytes 6 Not Estab. %   Eos 1 Not Estab. %   Basos 0 Not Estab. %   Neutrophils Absolute 4.0 1.4 - 7.0 x10E3/uL   Lymphocytes Absolute 1.6 0.7 - 3.1 x10E3/uL   Monocytes Absolute 0.4 0.1 - 0.9 x10E3/uL   EOS (ABSOLUTE) 0.1 0.0 - 0.4 x10E3/uL   Basophils Absolute 0.0 0.0 - 0.2 x10E3/uL   Immature Granulocytes 0 Not Estab. %   Immature Grans (Abs) 0.0 0.0 - 0.1 x10E3/uL  Comprehensive metabolic panel  Result Value Ref Range   Glucose 112 (H) 65 - 99 mg/dL   BUN 20 8 - 27 mg/dL   Creatinine, Ser 1.18 0.76 - 1.27 mg/dL   GFR calc non Af Amer 64 >59 mL/min/1.73   GFR calc Af Amer 74 >59 mL/min/1.73   BUN/Creatinine Ratio 17 10 - 24   Sodium 140 134 - 144 mmol/L   Potassium 4.5 3.5 - 5.2 mmol/L   Chloride 101 96 - 106 mmol/L   CO2 21 20 - 29 mmol/L   Calcium 10.1 8.6 - 10.2 mg/dL   Total Protein 8.0 6.0 - 8.5 g/dL   Albumin 4.8 3.6 - 4.8 g/dL   Globulin, Total 3.2 1.5 - 4.5 g/dL   Albumin/Globulin Ratio 1.5 1.2 - 2.2   Bilirubin Total 0.4 0.0 - 1.2 mg/dL   Alkaline Phosphatase 49 39 - 117 IU/L   AST 30 0 - 40 IU/L   ALT 44 0 - 44 IU/L  Lipid panel  Result Value Ref Range   Cholesterol, Total 173 100 - 199 mg/dL   Triglycerides 194 (H) 0 - 149 mg/dL   HDL 29 (L) >39 mg/dL   VLDL Cholesterol Cal 39 5 - 40 mg/dL   LDL Calculated 105 (H) 0 - 99 mg/dL   Chol/HDL Ratio 6.0 (H) 0.0 - 5.0 ratio  PSA  Result Value Ref Range   Prostate Specific Ag, Serum 0.5 0.0 - 4.0 ng/mL  TSH  Result  Value Ref Range   TSH 2.130 0.450 - 4.500 uIU/mL  Urinalysis, Routine w reflex microscopic  Result Value Ref Range   Specific Gravity, UA 1.010 1.005 - 1.030   pH, UA 5.0 5.0 - 7.5   Color, UA Yellow Yellow   Appearance Ur Clear Clear   Leukocytes, UA Negative Negative   Protein, UA Negative Negative/Trace   Glucose, UA Negative Negative   Ketones, UA Trace (A) Negative   RBC, UA Negative Negative   Bilirubin, UA Negative Negative   Urobilinogen, Ur 0.2 0.2 - 1.0 mg/dL   Nitrite, UA Negative Negative  Hepatitis C antibody  Result Value Ref Range   Hep C Virus Ab <0.1 0.0 - 0.9 s/co ratio  HIV antibody  Result Value Ref Range   HIV Screen 4th Generation wRfx Non Reactive Non Reactive      Assessment & Plan:   Problem List Items Addressed This Visit      Respiratory   Acute frontal sinusitis - Primary    Acute with no improvement x 3 weeks.  Negative flu and strep testing. Scripts for Augmentin and Tessalon sent.  Recommend use of Coricidin.  Avoid Sudafed, Ibuprofen, and Benadryl.  May use Claritin for nasal drainage as needed.  Sinus rinses at home and humidifier.  Return for worsening or continued symptoms.      Relevant Medications   amoxicillin-clavulanate (AUGMENTIN) 875-125 MG tablet   benzonatate (TESSALON) 200 MG capsule   Other Relevant Orders  Veritor Flu A/B Waived   Rapid Strep Screen ([facility] ONLY)       Follow up plan: Return if symptoms worsen or fail to improve.

## 2018-12-05 LAB — VERITOR FLU A/B WAIVED
INFLUENZA B: NEGATIVE
Influenza A: NEGATIVE

## 2018-12-05 LAB — CULTURE, GROUP A STREP: Strep A Culture: NEGATIVE

## 2018-12-05 LAB — RAPID STREP SCREEN (MED CTR MEBANE ONLY): STREP GP A AG, IA W/REFLEX: NEGATIVE

## 2019-03-03 ENCOUNTER — Other Ambulatory Visit: Payer: Self-pay

## 2019-03-03 ENCOUNTER — Encounter: Payer: Self-pay | Admitting: Family Medicine

## 2019-03-03 ENCOUNTER — Ambulatory Visit (INDEPENDENT_AMBULATORY_CARE_PROVIDER_SITE_OTHER): Payer: 59 | Admitting: Family Medicine

## 2019-03-03 DIAGNOSIS — Z7189 Other specified counseling: Secondary | ICD-10-CM

## 2019-03-03 DIAGNOSIS — R5383 Other fatigue: Secondary | ICD-10-CM | POA: Diagnosis not present

## 2019-03-03 DIAGNOSIS — R7989 Other specified abnormal findings of blood chemistry: Secondary | ICD-10-CM | POA: Diagnosis not present

## 2019-03-03 DIAGNOSIS — J019 Acute sinusitis, unspecified: Secondary | ICD-10-CM

## 2019-03-03 DIAGNOSIS — N400 Enlarged prostate without lower urinary tract symptoms: Secondary | ICD-10-CM | POA: Diagnosis not present

## 2019-03-03 NOTE — Assessment & Plan Note (Signed)
A voluntary discussion about advanced care planning including explanation and discussion of advanced directives was extentively discussed with the patient.  Explained about the healthcare proxy and living will was reviewed and packet with forms with expiration of how to fill them out was given.  Time spent: Encounter 16+ min individuals present: Patient 

## 2019-03-03 NOTE — Assessment & Plan Note (Signed)
The current medical regimen is effective;  continue present plan and medications.  

## 2019-03-03 NOTE — Assessment & Plan Note (Signed)
Followed by urology will check testosterone

## 2019-03-03 NOTE — Progress Notes (Signed)
There were no vitals taken for this visit.   Subjective:    Patient ID: Luke Cain, male    DOB: Feb 06, 1951, 68 y.o.   MRN: [id number]  HPI: Luke Cain is a 68 y.o. male  Annual exam med check   Telemedicine using audio/video telecommunications for a synchronous communication visit. Today's visit due to COVID-19 isolation precautions I connected with and verified that I am speaking with the correct person using two identifiers.   I discussed the limitations, risks, security and privacy concerns of performing an evaluation and management service by telecommunication and the availability of in person appointments. I also discussed with the patient that there may be a patient responsible charge related to this service. The patient expressed understanding and agreed to proceed. The patient's location is work I am at home.  Discussed with patient all in all doing well no complaints followed by urology for testosterone replacement and is doing well.  No other complaints are issues reviewed advance care planning with patient.  No further shortness of breath on exertion. BPH symptoms stable. Sinusitis resolved and is concerned may have had COVID-19 back in March when he was so sick.  Wants testing. Also wants testing for testosterone. Relevant past medical, surgical, family and social history reviewed and updated as indicated. Interim medical history since our last visit reviewed. Allergies and medications reviewed and updated.  Review of Systems  Constitutional: Negative.   HENT: Negative.   Eyes: Negative.   Respiratory: Negative.   Cardiovascular: Negative.   Gastrointestinal: Negative.   Endocrine: Negative.   Genitourinary: Negative.   Musculoskeletal: Negative.   Skin: Negative.   Allergic/Immunologic: Negative.   Neurological: Negative.   Hematological: Negative.   Psychiatric/Behavioral: Negative.     Per HPI unless specifically indicated above     Objective:     There were no vitals taken for this visit.  Wt Readings from Last 3 Encounters:  12/02/18 238 lb (108 kg)  02/25/18 231 lb (104.8 kg)  07/09/17 238 lb (108 kg)    Physical Exam  Results for orders placed or performed in visit on 12/02/18  Rapid Strep Screen ([facility] ONLY)  Result Value Ref Range   Strep Gp A Ag, IA W/Reflex Negative Negative  Culture, Group A Strep  Result Value Ref Range   Strep A Culture Negative   Veritor Flu A/B Waived  Result Value Ref Range   Influenza A Negative Negative   Influenza B Negative Negative      Assessment & Plan:   Problem List Items Addressed This Visit      Genitourinary   BPH (benign prostatic hyperplasia)    Followed by urology stable      Relevant Orders   Lipid panel   CBC with Differential/Platelet   TSH   Urinalysis, Routine w reflex microscopic   PSA     Other   Advanced care planning/counseling discussion - Primary    A voluntary discussion about advanced care planning including explanation and discussion of advanced directives was extentively discussed with the patient.  Explained about the healthcare proxy and living will was reviewed and packet with forms with expiration of how to fill them out was given.  Time spent: Encounter 16+ min individuals present: Patient      Low testosterone    Followed by urology will check testosterone      Relevant Orders   Testosterone   Comprehensive metabolic panel   Lipid panel   CBC  with Differential/Platelet   TSH   Urinalysis, Routine w reflex microscopic   PSA   Fatigue    The current medical regimen is effective;  continue present plan and medications.       Relevant Orders   Comprehensive metabolic panel   Lipid panel   CBC with Differential/Platelet   TSH   Urinalysis, Routine w reflex microscopic   PSA    Other Visit Diagnoses    Acute sinusitis, recurrence not specified, unspecified location       Relevant Orders   SAR CoV2 Serology (COVID  19)AB(IGG)IA       Follow up plan: Return in about 1 year (around 03/02/2020) for Physical Exam.

## 2019-03-03 NOTE — Assessment & Plan Note (Signed)
Followed by urology stable

## 2019-03-10 ENCOUNTER — Other Ambulatory Visit: Payer: Self-pay

## 2019-03-10 ENCOUNTER — Other Ambulatory Visit: Payer: 59

## 2019-03-10 DIAGNOSIS — N400 Enlarged prostate without lower urinary tract symptoms: Secondary | ICD-10-CM

## 2019-03-10 DIAGNOSIS — J019 Acute sinusitis, unspecified: Secondary | ICD-10-CM

## 2019-03-10 DIAGNOSIS — R5383 Other fatigue: Secondary | ICD-10-CM

## 2019-03-10 DIAGNOSIS — R7989 Other specified abnormal findings of blood chemistry: Secondary | ICD-10-CM

## 2019-03-10 LAB — URINALYSIS, ROUTINE W REFLEX MICROSCOPIC
Bilirubin, UA: NEGATIVE
Glucose, UA: NEGATIVE
Ketones, UA: NEGATIVE
Leukocytes,UA: NEGATIVE
Nitrite, UA: NEGATIVE
Protein,UA: NEGATIVE
RBC, UA: NEGATIVE
Specific Gravity, UA: 1.02 (ref 1.005–1.030)
Urobilinogen, Ur: 1 mg/dL (ref 0.2–1.0)
pH, UA: 6.5 (ref 5.0–7.5)

## 2019-03-11 LAB — CBC WITH DIFFERENTIAL/PLATELET
Basophils Absolute: 0 10*3/uL (ref 0.0–0.2)
Basos: 1 %
EOS (ABSOLUTE): 0.1 10*3/uL (ref 0.0–0.4)
Eos: 2 %
Hematocrit: 45.5 % (ref 37.5–51.0)
Hemoglobin: 16 g/dL (ref 13.0–17.7)
Immature Grans (Abs): 0 10*3/uL (ref 0.0–0.1)
Immature Granulocytes: 0 %
Lymphocytes Absolute: 1.7 10*3/uL (ref 0.7–3.1)
Lymphs: 30 %
MCH: 32.7 pg (ref 26.6–33.0)
MCHC: 35.2 g/dL (ref 31.5–35.7)
MCV: 93 fL (ref 79–97)
Monocytes Absolute: 0.5 10*3/uL (ref 0.1–0.9)
Monocytes: 9 %
Neutrophils Absolute: 3.2 10*3/uL (ref 1.4–7.0)
Neutrophils: 58 %
Platelets: 118 10*3/uL — ABNORMAL LOW (ref 150–450)
RBC: 4.9 x10E6/uL (ref 4.14–5.80)
RDW: 12.9 % (ref 11.6–15.4)
WBC: 5.6 10*3/uL (ref 3.4–10.8)

## 2019-03-11 LAB — LIPID PANEL
Chol/HDL Ratio: 5.7 ratio — ABNORMAL HIGH (ref 0.0–5.0)
Cholesterol, Total: 171 mg/dL (ref 100–199)
HDL: 30 mg/dL — ABNORMAL LOW (ref >39)
LDL Calculated: 97 mg/dL (ref 0–99)
Triglycerides: 221 mg/dL — ABNORMAL HIGH (ref 0–149)
VLDL Cholesterol Cal: 44 mg/dL — ABNORMAL HIGH (ref 5–40)

## 2019-03-11 LAB — COMPREHENSIVE METABOLIC PANEL
ALT: 31 IU/L (ref 0–44)
AST: 23 IU/L (ref 0–40)
Albumin/Globulin Ratio: 1.8 (ref 1.2–2.2)
Albumin: 4.5 g/dL (ref 3.8–4.8)
Alkaline Phosphatase: 36 IU/L — ABNORMAL LOW (ref 39–117)
BUN/Creatinine Ratio: 18 (ref 10–24)
BUN: 17 mg/dL (ref 8–27)
Bilirubin Total: 0.5 mg/dL (ref 0.0–1.2)
CO2: 22 mmol/L (ref 20–29)
Calcium: 9.4 mg/dL (ref 8.6–10.2)
Chloride: 101 mmol/L (ref 96–106)
Creatinine, Ser: 0.95 mg/dL (ref 0.76–1.27)
GFR calc Af Amer: 96 mL/min/{1.73_m2} (ref >59)
GFR calc non Af Amer: 83 mL/min/{1.73_m2} (ref >59)
Globulin, Total: 2.5 g/dL (ref 1.5–4.5)
Glucose: 138 mg/dL — ABNORMAL HIGH (ref 65–99)
Potassium: 3.9 mmol/L (ref 3.5–5.2)
Sodium: 138 mmol/L (ref 134–144)
Total Protein: 7 g/dL (ref 6.0–8.5)

## 2019-03-11 LAB — TESTOSTERONE: Testosterone: 1400 ng/dL — ABNORMAL HIGH (ref 264–916)

## 2019-03-11 LAB — SAR COV2 SEROLOGY (COVID19)AB(IGG),IA: SARS-CoV-2 Ab, IgG: NEGATIVE

## 2019-03-11 LAB — PSA: Prostate Specific Ag, Serum: 0.5 ng/mL (ref 0.0–4.0)

## 2019-03-11 LAB — TSH: TSH: 1.55 u[IU]/mL (ref 0.450–4.500)

## 2019-03-12 ENCOUNTER — Other Ambulatory Visit: Payer: Self-pay

## 2019-03-12 ENCOUNTER — Ambulatory Visit (INDEPENDENT_AMBULATORY_CARE_PROVIDER_SITE_OTHER): Payer: 59 | Admitting: Family Medicine

## 2019-03-12 ENCOUNTER — Encounter: Payer: Self-pay | Admitting: Family Medicine

## 2019-03-12 VITALS — BP 132/86 | HR 77 | Temp 98.2°F | Ht 71.0 in | Wt 240.0 lb

## 2019-03-12 DIAGNOSIS — Z23 Encounter for immunization: Secondary | ICD-10-CM | POA: Diagnosis not present

## 2019-03-12 DIAGNOSIS — Z0000 Encounter for general adult medical examination without abnormal findings: Secondary | ICD-10-CM

## 2019-03-12 NOTE — Progress Notes (Signed)
BP 132/86   Pulse 77   Temp 98.2 F (36.8 C) (Oral)   Ht 5\' 11"  (1.803 m)   Wt 240 lb (108.9 kg)   SpO2 94%   BMI 33.47 kg/m    Subjective:    Patient ID: Luke Cain, male    DOB: 1950-09-29, 68 y.o.   MRN: [id number]  HPI: Luke Cain is a 68 y.o. male presenting on 03/12/2019 for comprehensive medical examination. Current medical complaints include:none  He currently lives with: Interim Problems from his last visit: no  Depression Screen done today and results listed below:  Depression screen [facility] 2/9 03/12/2019 02/25/2018 07/09/2017 12/27/2016  Decreased Interest 0 0 0 0  Down, Depressed, Hopeless 0 0 0 0  PHQ - 2 Score 0 0 0 0  Altered sleeping 1 - - -  Tired, decreased energy 0 - - -  Feeling bad or failure about yourself  1 - - -  Trouble concentrating 0 - - -  Moving slowly or fidgety/restless 0 - - -  Suicidal thoughts 0 - - -  PHQ-9 Score 2 - - -    The patient does not have a history of falls. I did complete a risk assessment for falls. A plan of care for falls was documented.   Past Medical History:  Past Medical History:  Diagnosis Date  . Arthritis    hands, knees  . GERD (gastroesophageal reflux disease)   . Sleep apnea     Surgical History:  Past Surgical History:  Procedure Laterality Date  . ANAL FISSURE REPAIR     over 30 yrs ago  . COLONOSCOPY WITH PROPOFOL N/A 01/15/2017   Procedure: COLONOSCOPY WITH PROPOFOL;  Surgeon: Lucilla Lame, MD;  Location: [place];  Service: Endoscopy;  Laterality: N/A;  sleep apnea  . POLYPECTOMY  01/15/2017   Procedure: POLYPECTOMY;  Surgeon: Lucilla Lame, MD;  Location: [place];  Service: Endoscopy;;    Medications:  Current Outpatient Medications on File Prior to Visit  Medication Sig  . alprostadil (EDEX) 40 MCG injection 40 mcg by Intracavitary route as needed for erectile dysfunction. use no more than 3 times per week  . Arginine 1000 MG TABS   . Cholecalciferol (VITAMIN D3) 10000  units TABS   . loratadine (CLARITIN) 10 MG tablet Take 10 mg by mouth daily.  . Multiple Vitamin (MULTI-VITAMINS) TABS Take by mouth.  . testosterone enanthate (DELATESTRYL) 200 MG/ML injection Inject into the muscle every 14 (fourteen) days. For IM use only  . Turmeric, Curcuma Longa, (CURCUMIN) POWD by Does not apply route daily.  . vitamin B-12 (CYANOCOBALAMIN) 1000 MCG tablet Take 5,000 mcg by mouth.    No current facility-administered medications on file prior to visit.     Allergies:  No Known Allergies  Social History:  Social History   Socioeconomic History  . Marital status: Married    Spouse name: Not on file  . Number of children: Not on file  . Years of education: Not on file  . Highest education level: Not on file  Occupational History  . Not on file  Social Needs  . Financial resource strain: Not on file  . Food insecurity    Worry: Not on file    Inability: Not on file  . Transportation needs    Medical: Not on file    Non-medical: Not on file  Tobacco Use  . Smoking status: Former Smoker    Quit date: 04/14/1999  Years since quitting: 19.9  . Smokeless tobacco: Former Systems developer    Quit date: 04/14/1999  Substance and Sexual Activity  . Alcohol use: Yes    Alcohol/week: 4.0 standard drinks    Types: 4 Glasses of wine per week  . Drug use: No  . Sexual activity: Not on file  Lifestyle  . Physical activity    Days per week: Not on file    Minutes per session: Not on file  . Stress: Not on file  Relationships  . Social Herbalist on phone: Not on file    Gets together: Not on file    Attends religious service: Not on file    Active member of club or organization: Not on file    Attends meetings of clubs or organizations: Not on file    Relationship status: Not on file  . Intimate partner violence    Fear of current or ex partner: Not on file    Emotionally abused: Not on file    Physically abused: Not on file    Forced sexual activity: Not  on file  Other Topics Concern  . Not on file  Social History Narrative  . Not on file   Social History   Tobacco Use  Smoking Status Former Smoker  . Quit date: 04/14/1999  . Years since quitting: 19.9  Smokeless Tobacco Former Systems developer  . Quit date: 04/14/1999   Social History   Substance and Sexual Activity  Alcohol Use Yes  . Alcohol/week: 4.0 standard drinks  . Types: 4 Glasses of wine per week    Family History:  Family History  Problem Relation Age of Onset  . Thyroid disease Mother   . Varicose Veins Mother   . Congestive Heart Failure Mother   . Diabetes Father   . Heart disease Father   . Heart attack Father     Past medical history, surgical history, medications, allergies, family history and social history reviewed with patient today and changes made to appropriate areas of the chart.   Review of Systems - General ROS: negative Psychological ROS: negative Ophthalmic ROS: negative ENT ROS: negative Allergy and Immunology ROS: negative Hematological and Lymphatic ROS: negative Endocrine ROS: negative Respiratory ROS: no cough, shortness of breath, or wheezing Cardiovascular ROS: no chest pain or dyspnea on exertion Gastrointestinal ROS: no abdominal pain, change in bowel habits, or black or bloody stools Genito-Urinary ROS: no dysuria, trouble voiding, or hematuria Musculoskeletal ROS: negative Neurological ROS: no TIA or stroke symptoms Dermatological ROS: negative All other ROS negative except what is listed above and in the HPI.      Objective:    BP 132/86   Pulse 77   Temp 98.2 F (36.8 C) (Oral)   Ht 5\' 11"  (1.803 m)   Wt 240 lb (108.9 kg)   SpO2 94%   BMI 33.47 kg/m   Wt Readings from Last 3 Encounters:  03/12/19 240 lb (108.9 kg)  12/02/18 238 lb (108 kg)  02/25/18 231 lb (104.8 kg)    Physical Exam Vitals signs and nursing note reviewed.  Constitutional:      General: He is not in acute distress.    Appearance: He is well-developed.   HENT:     Head: Atraumatic.     Right Ear: Tympanic membrane and external ear normal.     Left Ear: Tympanic membrane and external ear normal.     Nose: Nose normal.     Mouth/Throat:  Mouth: Mucous membranes are moist.     Pharynx: Oropharynx is clear.  Eyes:     General: No scleral icterus.    Conjunctiva/sclera: Conjunctivae normal.     Pupils: Pupils are equal, round, and reactive to light.  Neck:     Musculoskeletal: Normal range of motion and neck supple.  Cardiovascular:     Rate and Rhythm: Normal rate and regular rhythm.     Heart sounds: Normal heart sounds. No murmur.  Pulmonary:     Effort: Pulmonary effort is normal. No respiratory distress.     Breath sounds: Normal breath sounds.  Abdominal:     General: Bowel sounds are normal. There is no distension.     Palpations: Abdomen is soft. There is no mass.     Tenderness: There is no abdominal tenderness. There is no guarding.  Genitourinary:    Comments: GU exam deferred, gets through Urology annually Musculoskeletal: Normal range of motion.        General: No tenderness.  Skin:    General: Skin is warm and dry.     Findings: No rash.  Neurological:     General: No focal deficit present.     Mental Status: He is alert and oriented to person, place, and time.     Deep Tendon Reflexes: Reflexes are normal and symmetric.  Psychiatric:        Mood and Affect: Mood normal.        Behavior: Behavior normal.        Thought Content: Thought content normal.        Judgment: Judgment normal.     Results for orders placed or performed in visit on 03/10/19  SAR CoV2 Serology (COVID 19)AB(IGG)IA   Specimen: Blood  Result Value Ref Range   [place]ott SARS-CoV-2 Ab, IgG Negative Negative  PSA  Result Value Ref Range   Prostate Specific Ag, Serum 0.5 0.0 - 4.0 ng/mL  Urinalysis, Routine w reflex microscopic  Result Value Ref Range   Specific Gravity, UA 1.020 1.005 - 1.030   pH, UA 6.5 5.0 - 7.5   Color, UA Yellow  Yellow   Appearance Ur Clear Clear   Leukocytes,UA Negative Negative   Protein,UA Negative Negative/Trace   Glucose, UA Negative Negative   Ketones, UA Negative Negative   RBC, UA Negative Negative   Bilirubin, UA Negative Negative   Urobilinogen, Ur 1.0 0.2 - 1.0 mg/dL   Nitrite, UA Negative Negative  TSH  Result Value Ref Range   TSH 1.550 0.450 - 4.500 uIU/mL  CBC with Differential/Platelet  Result Value Ref Range   WBC 5.6 3.4 - 10.8 x10E3/uL   RBC 4.90 4.14 - 5.80 x10E6/uL   Hemoglobin 16.0 13.0 - 17.7 g/dL   Hematocrit 45.5 37.5 - 51.0 %   MCV 93 79 - 97 fL   MCH 32.7 26.6 - 33.0 pg   MCHC 35.2 31.5 - 35.7 g/dL   RDW 12.9 11.6 - 15.4 %   Platelets 118 (L) 150 - 450 x10E3/uL   Neutrophils 58 Not Estab. %   Lymphs 30 Not Estab. %   Monocytes 9 Not Estab. %   Eos 2 Not Estab. %   Basos 1 Not Estab. %   Neutrophils Absolute 3.2 1.4 - 7.0 x10E3/uL   Lymphocytes Absolute 1.7 0.7 - 3.1 x10E3/uL   Monocytes Absolute 0.5 0.1 - 0.9 x10E3/uL   EOS (ABSOLUTE) 0.1 0.0 - 0.4 x10E3/uL   Basophils Absolute 0.0 0.0 - 0.2 x10E3/uL  Immature Granulocytes 0 Not Estab. %   Immature Grans (Abs) 0.0 0.0 - 0.1 x10E3/uL  Lipid panel  Result Value Ref Range   Cholesterol, Total 171 100 - 199 mg/dL   Triglycerides 221 (H) 0 - 149 mg/dL   HDL 30 (L) >39 mg/dL   VLDL Cholesterol Cal 44 (H) 5 - 40 mg/dL   LDL Calculated 97 0 - 99 mg/dL   Chol/HDL Ratio 5.7 (H) 0.0 - 5.0 ratio  Comprehensive metabolic panel  Result Value Ref Range   Glucose 138 (H) 65 - 99 mg/dL   BUN 17 8 - 27 mg/dL   Creatinine, Ser 0.95 0.76 - 1.27 mg/dL   GFR calc non Af Amer 83 >59 mL/min/1.73   GFR calc Af Amer 96 >59 mL/min/1.73   BUN/Creatinine Ratio 18 10 - 24   Sodium 138 134 - 144 mmol/L   Potassium 3.9 3.5 - 5.2 mmol/L   Chloride 101 96 - 106 mmol/L   CO2 22 20 - 29 mmol/L   Calcium 9.4 8.6 - 10.2 mg/dL   Total Protein 7.0 6.0 - 8.5 g/dL   Albumin 4.5 3.8 - 4.8 g/dL   Globulin, Total 2.5 1.5 - 4.5 g/dL    Albumin/Globulin Ratio 1.8 1.2 - 2.2   Bilirubin Total 0.5 0.0 - 1.2 mg/dL   Alkaline Phosphatase 36 (L) 39 - 117 IU/L   AST 23 0 - 40 IU/L   ALT 31 0 - 44 IU/L  Testosterone  Result Value Ref Range   Testosterone 1,400 (H) 264 - 916 ng/dL      Assessment & Plan:   Problem List Items Addressed This Visit    None    Visit Diagnoses    Annual physical exam    -  Primary   Need for pneumococcal vaccine       Relevant Orders   Pneumococcal conjugate vaccine 13-valent IM (Completed)      Discussed aspirin prophylaxis for myocardial infarction prevention  LABORATORY TESTING:  Health maintenance labs ordered today as discussed above.   The natural history of prostate cancer and ongoing controversy regarding screening and potential treatment outcomes of prostate cancer has been discussed with the patient. The meaning of a false positive PSA and a false negative PSA has been discussed. He indicates understanding of the limitations of this screening test and wishes not to proceed with screening PSA testing. Followed by Urology for this.    IMMUNIZATIONS:   - Tdap: Tetanus vaccination status reviewed: last tetanus booster within 10 years. - Influenza: Postponed to flu season - Pneumovax: Not applicable - Prevnar: Administered today - HPV: Not applicable - Zostavax vaccine: Up to date  SCREENING: - Colonoscopy: Up to date  Discussed with patient purpose of the colonoscopy is to detect colon cancer at curable precancerous or early stages   PATIENT COUNSELING:    Sexuality: Discussed sexually transmitted diseases, partner selection, use of condoms, avoidance of unintended pregnancy  and contraceptive alternatives.   Advised to avoid cigarette smoking.  I discussed with the patient that most people either abstain from alcohol or drink within safe limits (<=14/week and <=4 drinks/occasion for males, <=7/weeks and <= 3 drinks/occasion for females) and that the risk for alcohol  disorders and other health effects rises proportionally with the number of drinks per week and how often a drinker exceeds daily limits.  Discussed cessation/primary prevention of drug use and availability of treatment for abuse.   Diet: Encouraged to adjust caloric intake to maintain  or  achieve ideal body weight, to reduce intake of dietary saturated fat and total fat, to limit sodium intake by avoiding high sodium foods and not adding table salt, and to maintain adequate dietary potassium and calcium preferably from fresh fruits, vegetables, and low-fat dairy products.    stressed the importance of regular exercise  Injury prevention: Discussed safety belts, safety helmets, smoke detector, smoking near bedding or upholstery.   Dental health: Discussed importance of regular tooth brushing, flossing, and dental visits.   Follow up plan: NEXT PREVENTATIVE PHYSICAL DUE IN 1 YEAR. Return in about 6 months (around 09/11/2019) for 6 month f/u.

## 2019-03-14 ENCOUNTER — Encounter: Payer: Self-pay | Admitting: Family Medicine

## 2019-07-16 DIAGNOSIS — N401 Enlarged prostate with lower urinary tract symptoms: Secondary | ICD-10-CM | POA: Diagnosis not present

## 2019-07-16 DIAGNOSIS — E291 Testicular hypofunction: Secondary | ICD-10-CM | POA: Diagnosis not present

## 2019-07-16 DIAGNOSIS — N5201 Erectile dysfunction due to arterial insufficiency: Secondary | ICD-10-CM | POA: Diagnosis not present

## 2019-07-16 DIAGNOSIS — R3914 Feeling of incomplete bladder emptying: Secondary | ICD-10-CM | POA: Diagnosis not present

## 2019-10-28 ENCOUNTER — Encounter: Payer: Self-pay | Admitting: Family Medicine

## 2019-11-12 DIAGNOSIS — Z23 Encounter for immunization: Secondary | ICD-10-CM | POA: Diagnosis not present

## 2019-12-04 DIAGNOSIS — Z23 Encounter for immunization: Secondary | ICD-10-CM | POA: Diagnosis not present

## 2020-01-07 DIAGNOSIS — X32XXXA Exposure to sunlight, initial encounter: Secondary | ICD-10-CM | POA: Diagnosis not present

## 2020-01-07 DIAGNOSIS — D2271 Melanocytic nevi of right lower limb, including hip: Secondary | ICD-10-CM | POA: Diagnosis not present

## 2020-01-07 DIAGNOSIS — L570 Actinic keratosis: Secondary | ICD-10-CM | POA: Diagnosis not present

## 2020-01-07 DIAGNOSIS — D225 Melanocytic nevi of trunk: Secondary | ICD-10-CM | POA: Diagnosis not present

## 2020-01-07 DIAGNOSIS — Z85828 Personal history of other malignant neoplasm of skin: Secondary | ICD-10-CM | POA: Diagnosis not present

## 2020-01-07 DIAGNOSIS — L821 Other seborrheic keratosis: Secondary | ICD-10-CM | POA: Diagnosis not present

## 2020-01-07 DIAGNOSIS — L820 Inflamed seborrheic keratosis: Secondary | ICD-10-CM | POA: Diagnosis not present

## 2020-01-07 DIAGNOSIS — L538 Other specified erythematous conditions: Secondary | ICD-10-CM | POA: Diagnosis not present

## 2020-01-07 DIAGNOSIS — D485 Neoplasm of uncertain behavior of skin: Secondary | ICD-10-CM | POA: Diagnosis not present

## 2020-01-07 DIAGNOSIS — D2262 Melanocytic nevi of left upper limb, including shoulder: Secondary | ICD-10-CM | POA: Diagnosis not present

## 2020-01-07 DIAGNOSIS — C44722 Squamous cell carcinoma of skin of right lower limb, including hip: Secondary | ICD-10-CM | POA: Diagnosis not present

## 2020-01-27 DIAGNOSIS — C44722 Squamous cell carcinoma of skin of right lower limb, including hip: Secondary | ICD-10-CM | POA: Diagnosis not present

## 2020-03-17 ENCOUNTER — Encounter: Payer: 59 | Admitting: Family Medicine

## 2020-03-24 ENCOUNTER — Encounter: Payer: Self-pay | Admitting: Family Medicine

## 2020-03-25 ENCOUNTER — Encounter: Payer: Self-pay | Admitting: Family Medicine

## 2020-03-25 ENCOUNTER — Ambulatory Visit (INDEPENDENT_AMBULATORY_CARE_PROVIDER_SITE_OTHER): Payer: Medicare Other | Admitting: Family Medicine

## 2020-03-25 ENCOUNTER — Ambulatory Visit
Admission: RE | Admit: 2020-03-25 | Discharge: 2020-03-25 | Disposition: A | Discharge status: Home / Self Care | Payer: Medicare Other | Source: Ambulatory Visit | Attending: Family Medicine | Admitting: Family Medicine

## 2020-03-25 ENCOUNTER — Other Ambulatory Visit: Payer: Self-pay

## 2020-03-25 ENCOUNTER — Ambulatory Visit
Admission: RE | Admit: 2020-03-25 | Discharge: 2020-03-25 | Disposition: A | Discharge status: Home / Self Care | Payer: Medicare Other | Attending: Family Medicine | Admitting: Family Medicine

## 2020-03-25 VITALS — BP 158/86 | HR 64 | Temp 98.0°F | Ht 72.0 in | Wt 242.0 lb

## 2020-03-25 DIAGNOSIS — E781 Pure hyperglyceridemia: Secondary | ICD-10-CM

## 2020-03-25 DIAGNOSIS — R030 Elevated blood-pressure reading, without diagnosis of hypertension: Secondary | ICD-10-CM | POA: Diagnosis not present

## 2020-03-25 DIAGNOSIS — R0602 Shortness of breath: Secondary | ICD-10-CM | POA: Insufficient documentation

## 2020-03-25 DIAGNOSIS — N400 Enlarged prostate without lower urinary tract symptoms: Secondary | ICD-10-CM | POA: Diagnosis not present

## 2020-03-25 DIAGNOSIS — R7989 Other specified abnormal findings of blood chemistry: Secondary | ICD-10-CM

## 2020-03-25 DIAGNOSIS — R7309 Other abnormal glucose: Secondary | ICD-10-CM

## 2020-03-25 DIAGNOSIS — J90 Pleural effusion, not elsewhere classified: Secondary | ICD-10-CM | POA: Diagnosis not present

## 2020-03-25 MED ORDER — ALBUTEROL SULFATE (2.5 MG/3ML) 0.083% IN NEBU
2.5000 mg | INHALATION_SOLUTION | Freq: Once | RESPIRATORY_TRACT | Status: AC
  Administered 2020-03-25: 2.5 mg via RESPIRATORY_TRACT

## 2020-03-25 NOTE — Progress Notes (Signed)
BP (!) 158/86   Pulse 64   Temp 98 F (36.7 C) (Oral)   Ht 6' (1.829 m)   Wt 242 lb (109.8 kg)   SpO2 96%   BMI 32.82 kg/m    Subjective:    Patient ID: Luke Cain, male    DOB: [birth year], 69 y.o.   MRN: [id number]  HPI: Luke Cain is a 69 y.o. male  Chief Complaint  Patient presents with  . Shortness of Breath   Here today for 6 month f/u chronic conditions.   HTN - Home BPs typically 130-140/80s when checked occasionally.Not taking any medications for this. Denies CP, HAs, dizziness, syncope.   SOB gradually worsening the past few years. Does not exercise much anymore but notes even when he was in good shape a few years ago this was an issue . Exertional only, resolves at rest. Sometimes wheezes and has chest tightness. Previously a smoker, 30 year history but hasn't smoked in 20 years. Has never had a spirometry test done or been on inhalers. Denies LE edema.   Gets testosterone pellets inserted through a [facility] loss adjuster. Needs labs. Hx of BPH not taking anything for this, no new or worsening sxs.   Relevant past medical, surgical, family and social history reviewed and updated as indicated. Interim medical history since our last visit reviewed. Allergies and medications reviewed and updated.  Review of Systems  Per HPI unless specifically indicated above     Objective:    BP (!) 158/86   Pulse 64   Temp 98 F (36.7 C) (Oral)   Ht 6' (1.829 m)   Wt 242 lb (109.8 kg)   SpO2 96%   BMI 32.82 kg/m   Wt Readings from Last 3 Encounters:  03/25/20 242 lb (109.8 kg)  03/12/19 240 lb (108.9 kg)  12/02/18 238 lb (108 kg)    Physical Exam Vitals and nursing note reviewed.  Constitutional:      Appearance: Normal appearance.  HENT:     Head: Atraumatic.  Eyes:     Extraocular Movements: Extraocular movements intact.     Conjunctiva/sclera: Conjunctivae normal.  Cardiovascular:     Rate and Rhythm: Normal rate and regular rhythm.  Pulmonary:      Effort: Pulmonary effort is normal.     Breath sounds: Normal breath sounds.  Musculoskeletal:        General: Normal range of motion.     Cervical back: Normal range of motion and neck supple.  Skin:    General: Skin is warm and dry.  Neurological:     General: No focal deficit present.     Mental Status: He is oriented to person, place, and time.  Psychiatric:        Mood and Affect: Mood normal.        Thought Content: Thought content normal.        Judgment: Judgment normal.     Results for orders placed or performed in visit on 03/25/20  PSA  Result Value Ref Range   Prostate Specific Ag, Serum 0.5 0.0 - 4.0 ng/mL  Comprehensive metabolic panel  Result Value Ref Range   Glucose 149 (H) 65 - 99 mg/dL   BUN 11 8 - 27 mg/dL   Creatinine, Ser 1.22 0.76 - 1.27 mg/dL   GFR calc non Af Amer 61 >59 mL/min/1.73   GFR calc Af Amer 70 >59 mL/min/1.73   BUN/Creatinine Ratio 9 (L) 10 - 24   Sodium 141 134 -  144 mmol/L   Potassium 3.9 3.5 - 5.2 mmol/L   Chloride 98 96 - 106 mmol/L   CO2 21 20 - 29 mmol/L   Calcium 9.5 8.6 - 10.2 mg/dL   Total Protein 8.0 6.0 - 8.5 g/dL   Albumin 4.9 (H) 3.8 - 4.8 g/dL   Globulin, Total 3.1 1.5 - 4.5 g/dL   Albumin/Globulin Ratio 1.6 1.2 - 2.2   Bilirubin Total 0.7 0.0 - 1.2 mg/dL   Alkaline Phosphatase 40 (L) 48 - 121 IU/L   AST 24 0 - 40 IU/L   ALT 31 0 - 44 IU/L  CBC with Differential/Platelet  Result Value Ref Range   WBC 6.4 3.4 - 10.8 x10E3/uL   RBC 5.04 4.14 - 5.80 x10E6/uL   Hemoglobin 17.2 13.0 - 17.7 g/dL   Hematocrit 48.0 37.5 - 51.0 %   MCV 95 79 - 97 fL   MCH 34.1 (H) 26.6 - 33.0 pg   MCHC 35.8 (H) 31 - 35 g/dL   RDW 12.4 11.6 - 15.4 %   Platelets 125 (L) 150 - 450 x10E3/uL   Neutrophils 56 Not Estab. %   Lymphs 34 Not Estab. %   Monocytes 7 Not Estab. %   Eos 1 Not Estab. %   Basos 1 Not Estab. %   Neutrophils Absolute 3.6 1 - 7 x10E3/uL   Lymphocytes Absolute 2.2 0 - 3 x10E3/uL   Monocytes Absolute 0.4 0 - 0 x10E3/uL     EOS (ABSOLUTE) 0.1 0.0 - 0.4 x10E3/uL   Basophils Absolute 0.1 0 - 0 x10E3/uL   Immature Granulocytes 1 Not Estab. %   Immature Grans (Abs) 0.0 0.0 - 0.1 x10E3/uL  Lipid Panel w/o Chol/HDL Ratio  Result Value Ref Range   Cholesterol, Total 159 100 - 199 mg/dL   Triglycerides 133 0 - 149 mg/dL   HDL 29 (L) >39 mg/dL   VLDL Cholesterol Cal 24 5 - 40 mg/dL   LDL Chol Calc (NIH) 106 (H) 0 - 99 mg/dL  Testosterone, Free, Total, SHBG  Result Value Ref Range   Testosterone >1500 (H) 264 - 916 ng/dL   Testosterone, Free WILL FOLLOW    Sex Hormone Binding 39.4 19.3 - 76.4 nmol/L  HgB A1c  Result Value Ref Range   Hgb A1c MFr Bld 7.3 (H) 4.8 - 5.6 %   Est. average glucose Bld gHb Est-mCnc 163 mg/dL  B Nat Peptide  Result Value Ref Range   BNP 9.8 0.0 - 100.0 pg/mL      Assessment & Plan:   Problem List Items Addressed This Visit      Genitourinary   BPH (benign prostatic hyperplasia)    Recheck labs, asymptomatic      Relevant Orders   PSA (Completed)     Other   Elevated glucose    Per lab review. Patient states diet and exercise have been poor, wanting to work on these prior to starting any sort of medication if BSs are still elevated      Relevant Orders   Comprehensive metabolic panel (Completed)   HgB A1c (Completed)   Low testosterone    Followed by a specialist in [facility] and getting pellets - [facility]. Recheck testosterone today      Relevant Orders   Testosterone, Free, Total, SHBG (Completed)   Shortness of breath - Primary    EKG NSR without any ST or T wave changes, exam and vitals benign. Spirometry testing showing some evidence of COPD, which also fits with  his signs and sxs. Sample of stiolto given for trial with demo during visit. Follow up via mychart in 2 weeks with how things are going with inhaler. Has Cardiology follow up in a few months which he states will likely be followed by a repeat stress test as well       Other Visit Diagnoses     Elevated blood pressure reading       Work on [employer], exercise. Start monitoring home readings consistently and call with persistent high readings   SOB (shortness of breath)       Relevant Orders   Spirometry with graph (Completed)   EKG 12-Lead (Completed)   CBC with Differential/Platelet (Completed)   DG Chest 2 View (Completed)   B Nat Peptide (Completed)   Hypertriglyceridemia       Recheck lipids, adjust as needed. Diet and exercise changes reviewed   Relevant Orders   Lipid Panel w/o Chol/HDL Ratio (Completed)      60 minutes spent today in direct patient care, counseling, and education today  Follow up plan: Return in about 3 months (around 06/25/2020) for BP, A1C, SOB f/u.

## 2020-03-25 NOTE — Assessment & Plan Note (Addendum)
Followed by a specialist in [place] and getting pellets - [facility]. Recheck testosterone today

## 2020-03-26 ENCOUNTER — Telehealth: Payer: Self-pay | Admitting: Family Medicine

## 2020-03-26 NOTE — Telephone Encounter (Signed)
Pt given lab results and recommendations per notes of Rachel,PA on 03/26/20. Pt verbalized understanding. Patient states he would like to work on diet and exercise before starting on medication. Patient scheduled for follow up appt on 06/30/20.

## 2020-03-26 NOTE — Telephone Encounter (Signed)
Please let him know that his labs came back stable overall but his testosterone is elevated higher than goal so I would recommend dialing back on the amount of supplementation he's doing with the naturopath specialist, his cholesterol is a bit above goal, and his A1C is now in diabetic range. I would recommend starting on some metformin to help get this under better control in addition to diet and exercise changes, but I know he had implied yesterday he's hesitant to add medication. Please let me know if he'd rather add meformin or just work on diet and exercise. Either way, I'd like to see him back in 3 months to recheck

## 2020-03-26 NOTE — Telephone Encounter (Signed)
Called and LVM asking for patient to please return my call for results.

## 2020-03-30 NOTE — Assessment & Plan Note (Signed)
Recheck labs, asymptomatic

## 2020-03-30 NOTE — Assessment & Plan Note (Signed)
Per lab review. Patient states diet and exercise have been poor, wanting to work on these prior to starting any sort of medication if BSs are still elevated

## 2020-03-30 NOTE — Assessment & Plan Note (Signed)
EKG NSR without any ST or T wave changes, exam and vitals benign. Spirometry testing showing some evidence of COPD, which also fits with his signs and sxs. Sample of stiolto given for trial with demo during visit. Follow up via mychart in 2 weeks with how things are going with inhaler. Has Cardiology follow up in a few months which he states will likely be followed by a repeat stress test as well

## 2020-04-01 LAB — COMPREHENSIVE METABOLIC PANEL
ALT: 31 IU/L (ref 0–44)
AST: 24 IU/L (ref 0–40)
Albumin/Globulin Ratio: 1.6 (ref 1.2–2.2)
Albumin: 4.9 g/dL — ABNORMAL HIGH (ref 3.8–4.8)
Alkaline Phosphatase: 40 IU/L — ABNORMAL LOW (ref 48–121)
BUN/Creatinine Ratio: 9 — ABNORMAL LOW (ref 10–24)
BUN: 11 mg/dL (ref 8–27)
Bilirubin Total: 0.7 mg/dL (ref 0.0–1.2)
CO2: 21 mmol/L (ref 20–29)
Calcium: 9.5 mg/dL (ref 8.6–10.2)
Chloride: 98 mmol/L (ref 96–106)
Creatinine, Ser: 1.22 mg/dL (ref 0.76–1.27)
GFR calc Af Amer: 70 mL/min/{1.73_m2} (ref >59)
GFR calc non Af Amer: 61 mL/min/{1.73_m2} (ref >59)
Globulin, Total: 3.1 g/dL (ref 1.5–4.5)
Glucose: 149 mg/dL — ABNORMAL HIGH (ref 65–99)
Potassium: 3.9 mmol/L (ref 3.5–5.2)
Sodium: 141 mmol/L (ref 134–144)
Total Protein: 8 g/dL (ref 6.0–8.5)

## 2020-04-01 LAB — CBC WITH DIFFERENTIAL/PLATELET
Basophils Absolute: 0.1 10*3/uL (ref 0.0–0.2)
Basos: 1 %
EOS (ABSOLUTE): 0.1 10*3/uL (ref 0.0–0.4)
Eos: 1 %
Hematocrit: 48 % (ref 37.5–51.0)
Hemoglobin: 17.2 g/dL (ref 13.0–17.7)
Immature Grans (Abs): 0 10*3/uL (ref 0.0–0.1)
Immature Granulocytes: 1 %
Lymphocytes Absolute: 2.2 10*3/uL (ref 0.7–3.1)
Lymphs: 34 %
MCH: 34.1 pg — ABNORMAL HIGH (ref 26.6–33.0)
MCHC: 35.8 g/dL — ABNORMAL HIGH (ref 31.5–35.7)
MCV: 95 fL (ref 79–97)
Monocytes Absolute: 0.4 10*3/uL (ref 0.1–0.9)
Monocytes: 7 %
Neutrophils Absolute: 3.6 10*3/uL (ref 1.4–7.0)
Neutrophils: 56 %
Platelets: 125 10*3/uL — ABNORMAL LOW (ref 150–450)
RBC: 5.04 x10E6/uL (ref 4.14–5.80)
RDW: 12.4 % (ref 11.6–15.4)
WBC: 6.4 10*3/uL (ref 3.4–10.8)

## 2020-04-01 LAB — TESTOSTERONE, FREE, TOTAL, SHBG
Sex Hormone Binding: 39.4 nmol/L (ref 19.3–76.4)
Testosterone, Free: 30.8 pg/mL — ABNORMAL HIGH (ref 6.6–18.1)
Testosterone: >1500 ng/dL — ABNORMAL HIGH (ref 264–916)

## 2020-04-01 LAB — PSA: Prostate Specific Ag, Serum: 0.5 ng/mL (ref 0.0–4.0)

## 2020-04-01 LAB — LIPID PANEL W/O CHOL/HDL RATIO
Cholesterol, Total: 159 mg/dL (ref 100–199)
HDL: 29 mg/dL — ABNORMAL LOW (ref >39)
LDL Chol Calc (NIH): 106 mg/dL — ABNORMAL HIGH (ref 0–99)
Triglycerides: 133 mg/dL (ref 0–149)
VLDL Cholesterol Cal: 24 mg/dL (ref 5–40)

## 2020-04-01 LAB — BRAIN NATRIURETIC PEPTIDE: BNP: 9.8 pg/mL (ref 0.0–100.0)

## 2020-04-01 LAB — HEMOGLOBIN A1C
Est. average glucose Bld gHb Est-mCnc: 163 mg/dL
Hgb A1c MFr Bld: 7.3 % — ABNORMAL HIGH (ref 4.8–5.6)

## 2020-04-02 ENCOUNTER — Ambulatory Visit (INDEPENDENT_AMBULATORY_CARE_PROVIDER_SITE_OTHER): Payer: Medicare Other

## 2020-04-02 VITALS — Ht 71.0 in | Wt 242.0 lb

## 2020-04-02 DIAGNOSIS — Z0000 Encounter for general adult medical examination without abnormal findings: Secondary | ICD-10-CM

## 2020-04-02 NOTE — Progress Notes (Signed)
I connected with Luke Cain today by telephone and verified that I am speaking with the correct person using two identifiers. Location patient: home Location provider: work Persons participating in the virtual visit: Luke Cain, Luke Durand LPN.   I discussed the limitations, risks, security and privacy concerns of performing an evaluation and management service by telephone and the availability of in person appointments. I also discussed with the patient that there may be a patient responsible charge related to this service. The patient expressed understanding and verbally consented to this telephonic visit.    Interactive audio and video telecommunications were attempted between this provider and patient, however failed, due to patient having technical difficulties OR patient did not have access to video capability.  We continued and completed visit with audio only.    Vital signs may be patient reported or missing.   Subjective:   Luke Cain is a 69 y.o. male who presents for an Initial Medicare Annual Wellness Visit.  Review of Systems     Cardiac Risk Factors include: advanced age (>30men, >34 women);diabetes mellitus;male gender;obesity (BMI >30kg/m2);sedentary lifestyle     Objective:    Today's Vitals   04/02/20 0856  Weight: 242 lb (109.8 kg)  Height: 5\' 11"  (1.803 m)   Body mass index is 33.75 kg/m.  Advanced Directives 04/02/2020 01/15/2017  Does Patient Have a Medical Advance Directive? Yes No  Type of Paramedic of [place];Living will -  Copy of [place] in Chart? No - copy requested -  Would patient like information on creating a medical advance directive? - Yes (MAU/Ambulatory/Procedural Areas - Information given)    Current Medications (verified) Outpatient Encounter Medications as of 04/02/2020  Medication Sig  . alprostadil (EDEX) 40 MCG injection 40 mcg by Intracavitary route as needed for erectile  dysfunction. use no more than 3 times per week  . Arginine 1000 MG TABS Take by mouth daily.   . Cholecalciferol (VITAMIN D3) 10000 units TABS   . Multiple Vitamin (MULTI-VITAMINS) TABS Take by mouth.  . testosterone enanthate (DELATESTRYL) 200 MG/ML injection Inject into the muscle every 14 (fourteen) days. For IM use only  . Turmeric, Curcuma Longa, (CURCUMIN) POWD by Does not apply route daily.  . vitamin B-12 (CYANOCOBALAMIN) 1000 MCG tablet Take 5,000 mcg by mouth.    No facility-administered encounter medications on file as of 04/02/2020.    Allergies (verified) Patient has no known allergies.   History: Past Medical History:  Diagnosis Date  . Arthritis    hands, knees  . GERD (gastroesophageal reflux disease)   . Sleep apnea    Past Surgical History:  Procedure Laterality Date  . ANAL FISSURE REPAIR     over 30 yrs ago  . COLONOSCOPY WITH PROPOFOL N/A 01/15/2017   Procedure: COLONOSCOPY WITH PROPOFOL;  Surgeon: Lucilla Lame, MD;  Location: [facility];  Service: Endoscopy;  Laterality: N/A;  sleep apnea  . POLYPECTOMY  01/15/2017   Procedure: POLYPECTOMY;  Surgeon: Lucilla Lame, MD;  Location: [facility];  Service: Endoscopy;;   Family History  Problem Relation Age of Onset  . Thyroid disease Mother   . Varicose Veins Mother   . Congestive Heart Failure Mother   . Diabetes Father   . Heart disease Father   . Heart attack Father    Social History   Socioeconomic History  . Marital status: Married    Spouse name: Not on file  . Number of children: Not on file  .  Years of education: Not on file  . Highest education level: Not on file  Occupational History  . Occupation: semi retired  Tobacco Use  . Smoking status: Former Smoker    Quit date: 04/14/1999    Years since quitting: 20.9  . Smokeless tobacco: Former Systems developer    Quit date: 04/14/1999  Vaping Use  . Vaping Use: Never used  Substance and Sexual Activity  . Alcohol use: Yes    Alcohol/week:  5.0 - 6.0 standard drinks    Types: 5 - 6 Standard drinks or equivalent per week  . Drug use: No  . Sexual activity: Not on file  Other Topics Concern  . Not on file  Social History Narrative  . Not on file   Social Determinants of Health   Financial Resource Strain: Low Risk   . Difficulty of Paying Living Expenses: Not hard at all  Food Insecurity: No Food Insecurity  . Worried About Charity fundraiser in the Last Year: Never true  . Ran Out of Food in the Last Year: Never true  Transportation Needs: No Transportation Needs  . Lack of Transportation (Medical): No  . Lack of Transportation (Non-Medical): No  Physical Activity: Insufficiently Active  . Days of Exercise per Week: 2 days  . Minutes of Exercise per Session: 30 min  Stress: No Stress Concern Present  . Feeling of Stress : Not at all  Social Connections:   . Frequency of Communication with Friends and Family:   . Frequency of Social Gatherings with Friends and Family:   . Attends Religious Services:   . Active Member of Clubs or Organizations:   . Attends Archivist Meetings:   Marland Kitchen Marital Status:     Tobacco Counseling Counseling given: Not Answered   Clinical Intake:  Pre-visit preparation completed: Yes  Pain : No/denies pain     Nutritional Status: BMI > 30  Obese Nutritional Risks: None Diabetes: No  How often do you need to have someone help you when you read instructions, pamphlets, or other written materials from your doctor or pharmacy?: 1 - Never What is the last grade level you completed in school?: 12th grade  Diabetic? Yes Nutrition Risk Assessment:  Has the patient had any N/V/D within the last 2 months?  No  Does the patient have any non-healing wounds?  No  Has the patient had any unintentional weight loss or weight gain?  No   Diabetes:  Is the patient diabetic?  Yes  If diabetic, was a CBG obtained today?  No  Did the patient bring in their glucometer from home?  No   How often do you monitor your CBG's? Once a month.   Financial Strains and Diabetes Management:  Are you having any financial strains with the device, your supplies or your medication? No .  Does the patient want to be seen by Chronic Care Management for management of their diabetes?  No  Would the patient like to be referred to a Nutritionist or for Diabetic Management?  No   Diabetic Exams:  Diabetic Eye Exam: Completed  Diabetic Foot Exam: Overdue, Pt has been advised about the importance in completing this exam. Pt is scheduled for diabetic foot exam on next visit.   Interpreter Needed?: No  Information entered by :: NAllen LPN   Activities of Daily Living In your present state of health, do you have any difficulty performing the following activities: 04/02/2020 03/25/2020  Hearing? N N  Vision? N  N  Difficulty concentrating or making decisions? N N  Walking or climbing stairs? N N  Dressing or bathing? N N  Doing errands, shopping? N N  Preparing Food and eating ? N -  Using the Toilet? N -  In the past six months, have you accidently leaked urine? Y -  Do you have problems with loss of bowel control? N -  Managing your Medications? N -  Managing your Finances? N -  Housekeeping or managing your Housekeeping? N -  Some recent data might be hidden    Patient Care Team: Guadalupe Maple, MD as PCP - General (Family Medicine)  Indicate any recent Medical Services you may have received from other than [facility] providers in the past year (date may be approximate).     Assessment:   This is a routine wellness examination for Luke Cain.  Hearing/Vision screen  Hearing Screening   125Hz  250Hz  500Hz  1000Hz  2000Hz  3000Hz  4000Hz  6000Hz  8000Hz   Right ear:           Left ear:           Vision Screening Comments: Regular eye exams, [place]  Dietary issues and exercise activities discussed: Current Exercise Habits: Home exercise routine, Type of exercise: walking, Time  (Minutes): 30, Frequency (Times/Week): 2, Weekly Exercise (Minutes/Week): 60  Goals    . Patient Stated     04/02/2020, wants to exercise more, lose weight and lower A1C      Depression Screen PHQ 2/9 Scores 04/02/2020 03/25/2020 03/12/2019 02/25/2018 07/09/2017 12/27/2016  PHQ - 2 Score 0 0 0 0 0 0  PHQ- 9 Score - 2 2 - - -    Fall Risk Fall Risk  04/02/2020 03/25/2020 03/12/2019 03/12/2019 02/25/2018  Falls in the past year? 0 0 0 0 No  Number falls in past yr: - 0 0 - -  Injury with Fall? - 0 0 - -  Risk for fall due to : No Fall Risks - - - -  Follow up Falls evaluation completed;Education provided;Falls prevention discussed - - Education provided;Falls evaluation completed;Falls prevention discussed -    Any stairs in or around the home? Yes  If so, are there any without handrails? Yes  Home free of loose throw rugs in walkways, pet beds, electrical cords, etc? Yes  Adequate lighting in your home to reduce risk of falls? Yes   ASSISTIVE DEVICES UTILIZED TO PREVENT FALLS:  Life alert? No  Use of a cane, walker or w/c? No  Grab bars in the bathroom? No  Shower chair or bench in shower? No  Elevated toilet seat or a handicapped toilet? No   TIMED UP AND GO:  Was the test performed? No .   Cognitive Function:     6CIT Screen 04/02/2020  What Year? 0 points  What month? 0 points  What time? 0 points  Count back from 20 0 points  Months in reverse 4 points  Repeat phrase 0 points  Total Score 4    Immunizations Immunization History  Administered Date(s) Administered  . Influenza,inj,Quad PF,6+ Mos 10/24/2016  . PFIZER SARS-COV-2 Vaccination 11/12/2019, 12/04/2019  . Pneumococcal Conjugate-13 03/12/2019  . Tdap 05/02/2012  . Zoster Recombinat (Shingrix) 01/22/2017    TDAP status: Up to date Flu Vaccine status: Up to date Pneumococcal vaccine status: Due for PPSV23 Covid-19 vaccine status: Completed vaccines  Qualifies for Shingles Vaccine? Yes   Zostavax completed No     Shingrix Completed?: Yes  Screening Tests Health Maintenance  Topic Date Due  . PNA vac Low Risk Adult (2 of 2 - PPSV23) 03/11/2020  . INFLUENZA VACCINE  04/25/2020  . COLONOSCOPY  01/15/2022  . TETANUS/TDAP  05/02/2022  . COVID-19 Vaccine  Completed  . Hepatitis C Screening  Completed    Health Maintenance  Health Maintenance Due  Topic Date Due  . PNA vac Low Risk Adult (2 of 2 - PPSV23) 03/11/2020    Colorectal cancer screening: Completed 01/15/2017. Repeat every 5 years  Lung Cancer Screening: (Low Dose CT Chest recommended if Age 38-80 years, 30 pack-year currently smoking OR have quit w/in 15years.) does not qualify.   Lung Cancer Screening Referral: no  Additional Screening:  Hepatitis C Screening: does qualify; Completed 02/25/2018  Vision Screening: Recommended annual ophthalmology exams for early detection of glaucoma and other disorders of the eye. Is the patient up to date with their annual eye exam?  Yes  Who is the provider or what is the name of the office in which the patient attends annual eye exams? [facility] If pt is not established with a provider, would they like to be referred to a provider to establish care? No .   Dental Screening: Recommended annual dental exams for proper oral hygiene  Community Resource Referral / Chronic Care Management: CRR required this visit?  No   CCM required this visit?  No      Plan:     I have personally reviewed and noted the following in the patient's chart:   . Medical and social history . Use of alcohol, tobacco or illicit drugs  . Current medications and supplements . Functional ability and status . Nutritional status . Physical activity . Advanced directives . List of other physicians . Hospitalizations, surgeries, and ER visits in previous 12 months . Vitals . Screenings to include cognitive, depression, and falls . Referrals and appointments  In addition, I have reviewed and discussed with  patient certain preventive protocols, quality metrics, and best practice recommendations. A written personalized care plan for preventive services as well as general preventive health recommendations were provided to patient.     Kellie Simmering, LPN   11/29/4678   Nurse Notes: Patient is due for a pneumovax 23.

## 2020-04-02 NOTE — Patient Instructions (Signed)
Luke Cain , Thank you for taking time to come for your Medicare Wellness Visit. I appreciate your ongoing commitment to your health goals. Please review the following plan we discussed and let me know if I can assist you in the future.   Screening recommendations/referrals: Colonoscopy: completed 01/15/2017, due 01/15/2022 Recommended yearly ophthalmology/optometry visit for glaucoma screening and checkup Recommended yearly dental visit for hygiene and checkup  Vaccinations: Influenza vaccine: completed  Pneumococcal vaccine: completed 10/24/2016, due 04/25/2020 Tdap vaccine: completed 05/02/2012, due 05/02/2022 Shingles vaccine: discussed   Covid-19: 12/04/2019, 11/12/2019  Advanced directives: Please bring a copy of your POA (Power of Attorney) and/or Living Will to your next appointment.   Conditions/risks identified: none  Next appointment: Follow up in one year for your annual wellness visit.   Preventive Care 68 Years and Older, Male Preventive care refers to lifestyle choices and visits with your health care provider that can promote health and wellness. What does preventive care include?  A yearly physical exam. This is also called an annual well check.  Dental exams once or twice a year.  Routine eye exams. Ask your health care provider how often you should have your eyes checked.  Personal lifestyle choices, including:  Daily care of your teeth and gums.  Regular physical activity.  Eating a healthy diet.  Avoiding tobacco and drug use.  Limiting alcohol use.  Practicing safe sex.  Taking low doses of aspirin every day.  Taking vitamin and mineral supplements as recommended by your health care provider. What happens during an annual well check? The services and screenings done by your health care provider during your annual well check will depend on your age, overall health, lifestyle risk factors, and family history of disease. Counseling  Your health care provider  may ask you questions about your:  Alcohol use.  Tobacco use.  Drug use.  Emotional well-being.  Home and relationship well-being.  Sexual activity.  Eating habits.  History of falls.  Memory and ability to understand (cognition).  Work and work Statistician. Screening  You may have the following tests or measurements:  Height, weight, and BMI.  Blood pressure.  Lipid and cholesterol levels. These may be checked every 5 years, or more frequently if you are over 50 years old.  Skin check.  Lung cancer screening. You may have this screening every year starting at age 19 if you have a 30-pack-year history of smoking and currently smoke or have quit within the past 15 years.  Fecal occult blood test (FOBT) of the stool. You may have this test every year starting at age 55.  Flexible sigmoidoscopy or colonoscopy. You may have a sigmoidoscopy every 5 years or a colonoscopy every 10 years starting at age 47.  Prostate cancer screening. Recommendations will vary depending on your family history and other risks.  Hepatitis C blood test.  Hepatitis B blood test.  Sexually transmitted disease (STD) testing.  Diabetes screening. This is done by checking your blood sugar (glucose) after you have not eaten for a while (fasting). You may have this done every 1-3 years.  Abdominal aortic aneurysm (AAA) screening. You may need this if you are a current or former smoker.  Osteoporosis. You may be screened starting at age 73 if you are at high risk. Talk with your health care provider about your test results, treatment options, and if necessary, the need for more tests. Vaccines  Your health care provider may recommend certain vaccines, such as:  Influenza vaccine. This  is recommended every year.  Tetanus, diphtheria, and acellular pertussis (Tdap, Td) vaccine. You may need a Td booster every 10 years.  Zoster vaccine. You may need this after age 87.  Pneumococcal 13-valent  conjugate (PCV13) vaccine. One dose is recommended after age 43.  Pneumococcal polysaccharide (PPSV23) vaccine. One dose is recommended after age 80. Talk to your health care provider about which screenings and vaccines you need and how often you need them. This information is not intended to replace advice given to you by your health care provider. Make sure you discuss any questions you have with your health care provider. Document Released: 10/08/2015 Document Revised: 05/31/2016 Document Reviewed: 07/13/2015 Elsevier Interactive Patient Education  2017 [place] Prevention in the Home Falls can cause injuries. They can happen to people of all ages. There are many things you can do to make your home safe and to help prevent falls. What can I do on the outside of my home?  Regularly fix the edges of walkways and driveways and fix any cracks.  Remove anything that might make you trip as you walk through a door, such as a raised step or threshold.  Trim any bushes or trees on the path to your home.  Use bright outdoor lighting.  Clear any walking paths of anything that might make someone trip, such as rocks or tools.  Regularly check to see if handrails are loose or broken. Make sure that both sides of any steps have handrails.  Any raised decks and porches should have guardrails on the edges.  Have any leaves, snow, or ice cleared regularly.  Use sand or salt on walking paths during winter.  Clean up any spills in your garage right away. This includes oil or grease spills. What can I do in the bathroom?  Use night lights.  Install grab bars by the toilet and in the tub and shower. Do not use towel bars as grab bars.  Use non-skid mats or decals in the tub or shower.  If you need to sit down in the shower, use a plastic, non-slip stool.  Keep the floor dry. Clean up any water that spills on the floor as soon as it happens.  Remove soap buildup in the tub or  shower regularly.  Attach bath mats securely with double-sided non-slip rug tape.  Do not have throw rugs and other things on the floor that can make you trip. What can I do in the bedroom?  Use night lights.  Make sure that you have a light by your bed that is easy to reach.  Do not use any sheets or blankets that are too big for your bed. They should not hang down onto the floor.  Have a firm chair that has side arms. You can use this for support while you get dressed.  Do not have throw rugs and other things on the floor that can make you trip. What can I do in the kitchen?  Clean up any spills right away.  Avoid walking on wet floors.  Keep items that you use a lot in easy-to-reach places.  If you need to reach something above you, use a strong step stool that has a grab bar.  Keep electrical cords out of the way.  Do not use floor polish or wax that makes floors slippery. If you must use wax, use non-skid floor wax.  Do not have throw rugs and other things on the floor that can make you  trip. What can I do with my stairs?  Do not leave any items on the stairs.  Make sure that there are handrails on both sides of the stairs and use them. Fix handrails that are broken or loose. Make sure that handrails are as long as the stairways.  Check any carpeting to make sure that it is firmly attached to the stairs. Fix any carpet that is loose or worn.  Avoid having throw rugs at the top or bottom of the stairs. If you do have throw rugs, attach them to the floor with carpet tape.  Make sure that you have a light switch at the top of the stairs and the bottom of the stairs. If you do not have them, ask someone to add them for you. What else can I do to help prevent falls?  Wear shoes that:  Do not have high heels.  Have rubber bottoms.  Are comfortable and fit you well.  Are closed at the toe. Do not wear sandals.  If you use a stepladder:  Make sure that it is fully  opened. Do not climb a closed stepladder.  Make sure that both sides of the stepladder are locked into place.  Ask someone to hold it for you, if possible.  Clearly mark and make sure that you can see:  Any grab bars or handrails.  First and last steps.  Where the edge of each step is.  Use tools that help you move around (mobility aids) if they are needed. These include:  Canes.  Walkers.  Scooters.  Crutches.  Turn on the lights when you go into a dark area. Replace any light bulbs as soon as they burn out.  Set up your furniture so you have a clear path. Avoid moving your furniture around.  If any of your floors are uneven, fix them.  If there are any pets around you, be aware of where they are.  Review your medicines with your doctor. Some medicines can make you feel dizzy. This can increase your chance of falling. Ask your doctor what other things that you can do to help prevent falls. This information is not intended to replace advice given to you by your health care provider. Make sure you discuss any questions you have with your health care provider. Document Released: 07/08/2009 Document Revised: 02/17/2016 Document Reviewed: 10/16/2014 Elsevier Interactive Patient Education  2017 [employer].

## 2020-04-06 ENCOUNTER — Encounter: Payer: Self-pay | Admitting: Family Medicine

## 2020-04-08 ENCOUNTER — Encounter: Payer: Self-pay | Admitting: Family Medicine

## 2020-06-02 ENCOUNTER — Encounter: Payer: Self-pay | Admitting: Family Medicine

## 2020-06-30 ENCOUNTER — Ambulatory Visit: Payer: Self-pay | Admitting: Family Medicine

## 2020-07-02 DIAGNOSIS — Z23 Encounter for immunization: Secondary | ICD-10-CM | POA: Diagnosis not present

## 2020-07-09 ENCOUNTER — Encounter: Payer: Self-pay | Admitting: Family Medicine

## 2020-07-14 ENCOUNTER — Ambulatory Visit (INDEPENDENT_AMBULATORY_CARE_PROVIDER_SITE_OTHER): Payer: Medicare Other | Admitting: Family Medicine

## 2020-07-14 ENCOUNTER — Other Ambulatory Visit: Payer: Self-pay

## 2020-07-14 ENCOUNTER — Encounter: Payer: Self-pay | Admitting: Family Medicine

## 2020-07-14 VITALS — BP 132/80 | HR 74 | Temp 98.8°F | Ht 71.0 in | Wt 207.0 lb

## 2020-07-14 DIAGNOSIS — R7989 Other specified abnormal findings of blood chemistry: Secondary | ICD-10-CM

## 2020-07-14 DIAGNOSIS — G5601 Carpal tunnel syndrome, right upper limb: Secondary | ICD-10-CM

## 2020-07-14 DIAGNOSIS — Z23 Encounter for immunization: Secondary | ICD-10-CM

## 2020-07-14 DIAGNOSIS — E119 Type 2 diabetes mellitus without complications: Secondary | ICD-10-CM | POA: Diagnosis not present

## 2020-07-14 DIAGNOSIS — D696 Thrombocytopenia, unspecified: Secondary | ICD-10-CM | POA: Diagnosis not present

## 2020-07-14 DIAGNOSIS — R5383 Other fatigue: Secondary | ICD-10-CM

## 2020-07-14 DIAGNOSIS — K429 Umbilical hernia without obstruction or gangrene: Secondary | ICD-10-CM | POA: Diagnosis not present

## 2020-07-14 DIAGNOSIS — N400 Enlarged prostate without lower urinary tract symptoms: Secondary | ICD-10-CM | POA: Diagnosis not present

## 2020-07-14 LAB — URINALYSIS, ROUTINE W REFLEX MICROSCOPIC
Bilirubin, UA: NEGATIVE
Glucose, UA: NEGATIVE
Ketones, UA: NEGATIVE
Leukocytes,UA: NEGATIVE
Nitrite, UA: NEGATIVE
Protein,UA: NEGATIVE
RBC, UA: NEGATIVE
Specific Gravity, UA: 1.015 (ref 1.005–1.030)
Urobilinogen, Ur: 0.2 mg/dL (ref 0.2–1.0)
pH, UA: 7 (ref 5.0–7.5)

## 2020-07-14 LAB — BAYER DCA HB A1C WAIVED: HB A1C (BAYER DCA - WAIVED): 5 % (ref <7.0)

## 2020-07-14 LAB — MICROALBUMIN, URINE WAIVED
Creatinine, Urine Waived: 50 mg/dL (ref 10–300)
Microalb, Ur Waived: 10 mg/L (ref 0–19)
Microalb/Creat Ratio: <30 mg/g (ref <30)

## 2020-07-14 MED ORDER — STIOLTO RESPIMAT 2.5-2.5 MCG/ACT IN AERS
2.0000 | INHALATION_SPRAY | Freq: Every day | RESPIRATORY_TRACT | 1 refills | Status: DC
Start: 2020-07-14 — End: 2021-01-12

## 2020-07-15 LAB — CBC WITH DIFFERENTIAL/PLATELET
Basophils Absolute: 0.1 10*3/uL (ref 0.0–0.2)
Basos: 1 %
EOS (ABSOLUTE): 0.1 10*3/uL (ref 0.0–0.4)
Eos: 2 %
Hematocrit: 47.4 % (ref 37.5–51.0)
Hemoglobin: 15.8 g/dL (ref 13.0–17.7)
Immature Grans (Abs): 0 10*3/uL (ref 0.0–0.1)
Immature Granulocytes: 0 %
Lymphocytes Absolute: 1.2 10*3/uL (ref 0.7–3.1)
Lymphs: 21 %
MCH: 31.9 pg (ref 26.6–33.0)
MCHC: 33.3 g/dL (ref 31.5–35.7)
MCV: 96 fL (ref 79–97)
Monocytes Absolute: 0.5 10*3/uL (ref 0.1–0.9)
Monocytes: 9 %
Neutrophils Absolute: 3.7 10*3/uL (ref 1.4–7.0)
Neutrophils: 67 %
Platelets: 125 10*3/uL — ABNORMAL LOW (ref 150–450)
RBC: 4.96 x10E6/uL (ref 4.14–5.80)
RDW: 12.3 % (ref 11.6–15.4)
WBC: 5.6 10*3/uL (ref 3.4–10.8)

## 2020-07-15 LAB — COMPREHENSIVE METABOLIC PANEL
ALT: 19 IU/L (ref 0–44)
AST: 20 IU/L (ref 0–40)
Albumin/Globulin Ratio: 1.7 (ref 1.2–2.2)
Albumin: 4.5 g/dL (ref 3.8–4.8)
Alkaline Phosphatase: 44 IU/L (ref 44–121)
BUN/Creatinine Ratio: 19 (ref 10–24)
BUN: 20 mg/dL (ref 8–27)
Bilirubin Total: 0.4 mg/dL (ref 0.0–1.2)
CO2: 24 mmol/L (ref 20–29)
Calcium: 9.6 mg/dL (ref 8.6–10.2)
Chloride: 102 mmol/L (ref 96–106)
Creatinine, Ser: 1.03 mg/dL (ref 0.76–1.27)
GFR calc Af Amer: 86 mL/min/{1.73_m2} (ref >59)
GFR calc non Af Amer: 75 mL/min/{1.73_m2} (ref >59)
Globulin, Total: 2.7 g/dL (ref 1.5–4.5)
Glucose: 120 mg/dL — ABNORMAL HIGH (ref 65–99)
Potassium: 4.7 mmol/L (ref 3.5–5.2)
Sodium: 138 mmol/L (ref 134–144)
Total Protein: 7.2 g/dL (ref 6.0–8.5)

## 2020-07-15 LAB — LIPID PANEL W/O CHOL/HDL RATIO
Cholesterol, Total: 139 mg/dL (ref 100–199)
HDL: 44 mg/dL (ref >39)
LDL Chol Calc (NIH): 86 mg/dL (ref 0–99)
Triglycerides: 38 mg/dL (ref 0–149)
VLDL Cholesterol Cal: 9 mg/dL (ref 5–40)

## 2020-07-15 LAB — TSH: TSH: 1.61 u[IU]/mL (ref 0.450–4.500)

## 2020-07-15 LAB — PSA: Prostate Specific Ag, Serum: 1.4 ng/mL (ref 0.0–4.0)

## 2020-07-15 NOTE — Assessment & Plan Note (Signed)
Continue to follow with urology. Call with any concerns. Continue to monitor.  

## 2020-07-15 NOTE — Assessment & Plan Note (Signed)
Doing GREAT with A1c of 5.0! Continue weight loss. Continue to monitor. Call with any concerns. Recheck 6 months.

## 2020-07-15 NOTE — Assessment & Plan Note (Signed)
Checking labs today. Await results. Treat as needed.  

## 2020-07-15 NOTE — Assessment & Plan Note (Signed)
Under good control on current regimen. Continue current regimen. Continue to monitor. Call with any concerns. Refills given. Labs drawn today.   

## 2020-07-15 NOTE — Progress Notes (Signed)
BP 132/80 (BP Location: Left Arm, Cuff Size: Normal)   Pulse 74   Temp 98.8 F (37.1 C) (Oral)   Ht 5\' 11"  (1.803 m)   Wt 207 lb (93.9 kg)   SpO2 97%   BMI 28.87 kg/m    Subjective:    Patient ID: Luke Cain, male    DOB: [birth year], 69 y.o.   MRN: [id number]  HPI: Luke Cain is a 69 y.o. male  Chief Complaint  Patient presents with  . Hypertension    follow up   . Shortness of Breath  . [id number]   HYPERTENSION Hypertension status: stable  Satisfied with current treatment? yes Duration of hypertension: chronic BP monitoring frequency:  not checking BP range: 110/70- 140s/80s BP medication side effects:  no Medication compliance: excellent compliance Previous BP meds: Aspirin: yes Recurrent headaches: no Visual changes: no Palpitations: no Dyspnea: no Chest pain: no Lower extremity edema: no Dizzy/lightheaded: no  DIABETES- had some pretty significant weight loss over the past 3 months and has been checking his sugars and they're doing great Hypoglycemic episodes:no Polydipsia/polyuria: no Visual disturbance: no Chest pain: no Paresthesias: yes Glucose Monitoring: yes             Accucheck frequency: Daily Taking Insulin?: no Blood Pressure Monitoring: not checking Retinal Examination: Not up to Date Foot Exam: Not up to Date Diabetic Education: Completed Pneumovax: Up to Date Influenza: Up to Date Aspirin: yes  NUMBNESS Duration: 3-4 months Onset: sudden Location: tips of his fingers R>L and a little in his R foot Bilateral: yes Symmetric: no Decreased sensation: yes  Weakness: no Pain: no Quality:  Decreased sensation Severity: moderate  Frequency: constant Trauma: no Recent illness: no Diabetes: yes Thyroid disease: yes  HIV: no  Alcoholism: no  Spinal cord injury: no Status: stable  Relevant past medical, surgical, family and social history reviewed and updated as indicated. Interim medical history since our last visit  reviewed. Allergies and medications reviewed and updated.  Review of Systems  Constitutional: Negative.   HENT: Negative.   Respiratory: Negative.   Cardiovascular: Negative.   Gastrointestinal: Negative.   Musculoskeletal: Negative.   Neurological: Positive for numbness. Negative for dizziness, tremors, seizures, syncope, facial asymmetry, speech difficulty, weakness, light-headedness and headaches.  Psychiatric/Behavioral: Negative.     Per HPI unless specifically indicated above     Objective:    BP 132/80 (BP Location: Left Arm, Cuff Size: Normal)   Pulse 74   Temp 98.8 F (37.1 C) (Oral)   Ht 5\' 11"  (1.803 m)   Wt 207 lb (93.9 kg)   SpO2 97%   BMI 28.87 kg/m   Wt Readings from Last 3 Encounters:  07/14/20 207 lb (93.9 kg)  04/02/20 242 lb (109.8 kg)  03/25/20 242 lb (109.8 kg)    Physical Exam Vitals and nursing note reviewed.  Constitutional:      General: He is not in acute distress.    Appearance: Normal appearance. He is not ill-appearing, toxic-appearing or diaphoretic.  HENT:     Head: Normocephalic and atraumatic.     Right Ear: External ear normal.     Left Ear: External ear normal.     Nose: Nose normal.     Mouth/Throat:     Mouth: Mucous membranes are moist.     Pharynx: Oropharynx is clear.  Eyes:     General: No scleral icterus.       Right eye: No discharge.  Left eye: No discharge.     Extraocular Movements: Extraocular movements intact.     Conjunctiva/sclera: Conjunctivae normal.     Pupils: Pupils are equal, round, and reactive to light.  Cardiovascular:     Rate and Rhythm: Normal rate and regular rhythm.     Pulses: Normal pulses.     Heart sounds: Normal heart sounds. No murmur heard.  No friction rub. No gallop.   Pulmonary:     Effort: Pulmonary effort is normal. No respiratory distress.     Breath sounds: Normal breath sounds. No stridor. No wheezing, rhonchi or rales.  Chest:     Chest wall: No tenderness.  Abdominal:       Comments: Small umbilical hernia  Musculoskeletal:        General: Normal range of motion.     Cervical back: Normal range of motion and neck supple.     Comments: + Tinels and + phalens on the R wrist  Skin:    General: Skin is warm and dry.     Capillary Refill: Capillary refill takes less than 2 seconds.     Coloration: Skin is not jaundiced or pale.     Findings: No bruising, erythema, lesion or rash.  Neurological:     General: No focal deficit present.     Mental Status: He is alert and oriented to person, place, and time. Mental status is at baseline.  Psychiatric:        Mood and Affect: Mood normal.        Behavior: Behavior normal.        Thought Content: Thought content normal.        Judgment: Judgment normal.     Results for orders placed or performed in visit on 07/14/20  Bayer DCA Hb A1c Waived  Result Value Ref Range   HB A1C (BAYER DCA - WAIVED) 5.0 <7.0 %  CBC with Differential/Platelet  Result Value Ref Range   WBC 5.6 3.4 - 10.8 x10E3/uL   RBC 4.96 4.14 - 5.80 x10E6/uL   Hemoglobin 15.8 13.0 - 17.7 g/dL   Hematocrit 47.4 37.5 - 51.0 %   MCV 96 79 - 97 fL   MCH 31.9 26.6 - 33.0 pg   MCHC 33.3 31 - 35 g/dL   RDW 12.3 11.6 - 15.4 %   Platelets 125 (L) 150 - 450 x10E3/uL   Neutrophils 67 Not Estab. %   Lymphs 21 Not Estab. %   Monocytes 9 Not Estab. %   Eos 2 Not Estab. %   Basos 1 Not Estab. %   Neutrophils Absolute 3.7 1.40 - 7.00 x10E3/uL   Lymphocytes Absolute 1.2 0 - 3 x10E3/uL   Monocytes Absolute 0.5 0 - 0 x10E3/uL   EOS (ABSOLUTE) 0.1 0.0 - 0.4 x10E3/uL   Basophils Absolute 0.1 0 - 0 x10E3/uL   Immature Granulocytes 0 Not Estab. %   Immature Grans (Abs) 0.0 0.0 - 0.1 x10E3/uL  Comprehensive metabolic panel  Result Value Ref Range   Glucose 120 (H) 65 - 99 mg/dL   BUN 20 8 - 27 mg/dL   Creatinine, Ser 1.03 0.76 - 1.27 mg/dL   GFR calc non Af Amer 75 >59 mL/min/1.73   GFR calc Af Amer 86 >59 mL/min/1.73   BUN/Creatinine Ratio 19 10 -  24   Sodium 138 134 - 144 mmol/L   Potassium 4.7 3.5 - 5.2 mmol/L   Chloride 102 96 - 106 mmol/L   CO2 24 20 - 29  mmol/L   Calcium 9.6 8.6 - 10.2 mg/dL   Total Protein 7.2 6.0 - 8.5 g/dL   Albumin 4.5 3.8 - 4.8 g/dL   Globulin, Total 2.7 1.5 - 4.5 g/dL   Albumin/Globulin Ratio 1.7 1.2 - 2.2   Bilirubin Total 0.4 0.0 - 1.2 mg/dL   Alkaline Phosphatase 44 44 - 121 IU/L   AST 20 0 - 40 IU/L   ALT 19 0 - 44 IU/L  Lipid Panel w/o Chol/HDL Ratio  Result Value Ref Range   Cholesterol, Total 139 100 - 199 mg/dL   Triglycerides 38 0 - 149 mg/dL   HDL 44 >39 mg/dL   VLDL Cholesterol Cal 9 5 - 40 mg/dL   LDL Chol Calc (NIH) 86 0 - 99 mg/dL  Microalbumin, Urine Waived  Result Value Ref Range   Microalb, Ur Waived 10 0 - 19 mg/L   Creatinine, Urine Waived 50 10 - 300 mg/dL   Microalb/Creat Ratio <30 <30 mg/g  PSA  Result Value Ref Range   Prostate Specific Ag, Serum 1.4 0.0 - 4.0 ng/mL  TSH  Result Value Ref Range   TSH 1.610 0.450 - 4.500 uIU/mL  Urinalysis, Routine w reflex microscopic  Result Value Ref Range   Specific Gravity, UA 1.015 1.005 - 1.030   pH, UA 7.0 5.0 - 7.5   Color, UA Yellow Yellow   Appearance Ur Clear Clear   Leukocytes,UA Negative Negative   Protein,UA Negative Negative/Trace   Glucose, UA Negative Negative   Ketones, UA Negative Negative   RBC, UA Negative Negative   Bilirubin, UA Negative Negative   Urobilinogen, Ur 0.2 0.2 - 1.0 mg/dL   Nitrite, UA Negative Negative      Assessment & Plan:   Problem List Items Addressed This Visit      Endocrine   Type 2 diabetes mellitus without complications ([place]) - Primary    Doing GREAT with A1c of 5.0! Continue weight loss. Continue to monitor. Call with any concerns. Recheck 6 months.       Relevant Orders   Bayer DCA Hb A1c Waived (Completed)   CBC with Differential/Platelet (Completed)   Comprehensive metabolic panel (Completed)   Lipid Panel w/o Chol/HDL Ratio (Completed)   Microalbumin, Urine Waived  (Completed)     Genitourinary   BPH (benign prostatic hyperplasia)    Under good control on current regimen. Continue current regimen. Continue to monitor. Call with any concerns. Refills given. Labs drawn today.       Relevant Orders   CBC with Differential/Platelet (Completed)   Comprehensive metabolic panel (Completed)   PSA (Completed)   Urinalysis, Routine w reflex microscopic (Completed)     Other   Low testosterone    Continue to follow with urology. Call with any concerns. Continue to monitor.       Relevant Orders   CBC with Differential/Platelet (Completed)   Comprehensive metabolic panel (Completed)   PSA (Completed)   Fatigue    Checking labs today. Await results. Treat as needed.       Relevant Orders   CBC with Differential/Platelet (Completed)   Comprehensive metabolic panel (Completed)   TSH (Completed)    Other Visit Diagnoses    Thrombocytopenia ([place])       Rechecking labs today. Await results. Treat as needed.    Umbilical hernia without obstruction and without gangrene       Referral to general surgery made today. Call with any concerns.    Relevant Orders   Ambulatory  referral to General Surgery   Carpal tunnel syndrome of right wrist       Referral to hand specialist made today. Call with any concerns.    Relevant Orders   Ambulatory referral to Hand Surgery   Need for pneumococcal vaccination       Pneumovax given today.   Relevant Orders   Pneumococcal polysaccharide vaccine 23-valent greater than or equal to 2yo subcutaneous/IM (Completed)       Follow up plan: Return in about 6 months (around 01/12/2021) for physical.

## 2020-07-20 ENCOUNTER — Ambulatory Visit (INDEPENDENT_AMBULATORY_CARE_PROVIDER_SITE_OTHER): Payer: Medicare Other | Admitting: Surgery

## 2020-07-20 ENCOUNTER — Ambulatory Visit: Payer: Self-pay | Admitting: Surgery

## 2020-07-20 ENCOUNTER — Other Ambulatory Visit: Payer: Self-pay

## 2020-07-20 ENCOUNTER — Encounter: Payer: Self-pay | Admitting: Surgery

## 2020-07-20 VITALS — BP 149/80 | HR 69 | Temp 98.2°F | Ht 71.0 in | Wt 207.0 lb

## 2020-07-20 DIAGNOSIS — K429 Umbilical hernia without obstruction or gangrene: Secondary | ICD-10-CM | POA: Insufficient documentation

## 2020-07-20 DIAGNOSIS — G5603 Carpal tunnel syndrome, bilateral upper limbs: Secondary | ICD-10-CM | POA: Diagnosis not present

## 2020-07-20 NOTE — Patient Instructions (Signed)
You have requested for your Umbilical Hernia be repaired. This will be scheduled at [facility] with Dr. Christian Mate.  Please see your (blue)pre-care sheet for information. Our surgery scheduler will be in contact with you to look at surgery dates and go over instructions.   You will need to arrange to be off work for 1-2 weeks but will have to have a lifting restriction of no more than 15 lbs for 6 weeks following your surgery.   Umbilical Hernia, Adult A hernia is a bulge of tissue that pushes through an opening between muscles. An umbilical hernia happens in the abdomen, near the belly button (umbilicus). The hernia may contain tissues from the small intestine, large intestine, or fatty tissue covering the intestines (omentum). Umbilical hernias in adults tend to get worse over time, and they require surgical treatment. There are several types of umbilical hernias. You may have:  A hernia located just above or below the umbilicus (indirect hernia). This is the most common type of umbilical hernia in adults.  A hernia that forms through an opening formed by the umbilicus (direct hernia).  A hernia that comes and goes (reducible hernia). A reducible hernia may be visible only when you strain, lift something heavy, or cough. This type of hernia can be pushed back into the abdomen (reduced).  A hernia that traps abdominal tissue inside the hernia (incarcerated hernia). This type of hernia cannot be reduced.  A hernia that cuts off blood flow to the tissues inside the hernia (strangulated hernia). The tissues can start to die if this happens. This type of hernia requires emergency treatment.  What are the causes? An umbilical hernia happens when tissue inside the abdomen presses on a weak area of the abdominal muscles. What increases the risk? You may have a greater risk of this condition if you:  Are obese.  Have had several pregnancies.  Have a buildup of fluid inside your abdomen  (ascites).  Have had surgery that weakens the abdominal muscles.  What are the signs or symptoms? The main symptom of this condition is a painless bulge at or near the belly button. A reducible hernia may be visible only when you strain, lift something heavy, or cough. Other symptoms may include:  Dull pain.  A feeling of pressure.  Symptoms of a strangulated hernia may include:  Pain that gets increasingly worse.  Nausea and vomiting.  Pain when pressing on the hernia.  Skin over the hernia becoming red or purple.  Constipation.  Blood in the stool.  How is this diagnosed? This condition may be diagnosed based on:  A physical exam. You may be asked to cough or strain while standing. These actions increase the pressure inside your abdomen and force the hernia through the opening in your muscles. Your health care provider may try to reduce the hernia by pressing on it.  Your symptoms and medical history.  How is this treated? Surgery is the only treatment for an umbilical hernia. Surgery for a strangulated hernia is done as soon as possible. If you have a small hernia that is not incarcerated, you may need to lose weight before having surgery. Follow these instructions at home:  Lose weight, if told by your health care provider.  Do not try to push the hernia back in.  Watch your hernia for any changes in color or size. Tell your health care provider if any changes occur.  You may need to avoid activities that increase pressure on your hernia.  Do not lift anything that is heavier than 10 lb (4.5 kg) until your health care provider says that this is safe.  Take over-the-counter and prescription medicines only as told by your health care provider.  Keep all follow-up visits as told by your health care provider. This is important. Contact a health care provider if:  Your hernia gets larger.  Your hernia becomes painful. Get help right away if:  You develop sudden,  severe pain near the area of your hernia.  You have pain as well as nausea or vomiting.  You have pain and the skin over your hernia changes color.  You develop a fever. This information is not intended to replace advice given to you by your health care provider. Make sure you discuss any questions you have with your health care provider. Document Released: 02/11/2016 Document Revised: 05/14/2016 Document Reviewed: 02/11/2016 Elsevier Interactive Patient Education  [employer].

## 2020-07-20 NOTE — Progress Notes (Signed)
Patient ID: Luke Cain, male   DOB: Aug 17, 1951, 69 y.o.   MRN: [id number]  Chief Complaint: Umbilical hernia  History of Present Illness Luke Cain is a 69 y.o. male with known umbilical hernia over the last year.  Intentional weight loss is made it more obvious through his clothing.  Although its bulging, is readily reducible.  He denies pain, but it has increased in relative size.  Past Medical History Past Medical History:  Diagnosis Date  . Arthritis    hands, knees  . Sleep apnea    Uses Cpap      Past Surgical History:  Procedure Laterality Date  . ANAL FISSURE REPAIR     over 30 yrs ago  . COLONOSCOPY WITH PROPOFOL N/A 01/15/2017   Procedure: COLONOSCOPY WITH PROPOFOL;  Surgeon: Lucilla Lame, MD;  Location: [place];  Service: Endoscopy;  Laterality: N/A;  sleep apnea  . POLYPECTOMY  01/15/2017   Procedure: POLYPECTOMY;  Surgeon: Lucilla Lame, MD;  Location: [place];  Service: Endoscopy;;    No Known Allergies  Current Outpatient Medications  Medication Sig Dispense Refill  . alprostadil (EDEX) 40 MCG injection 40 mcg by Intracavitary route as needed for erectile dysfunction. use no more than 3 times per week    . Cholecalciferol (VITAMIN D3) 10000 units TABS     . hydroxychloroquine (PLAQUENIL) 200 MG tablet Take by mouth daily.    . Multiple Vitamin (MULTI-VITAMINS) TABS Take by mouth.    . Multiple Vitamins-Minerals (ZINC PO) Take by mouth daily.    Marland Kitchen testosterone enanthate (DELATESTRYL) 200 MG/ML injection Inject into the muscle every 14 (fourteen) days. For IM use only    . Tiotropium Bromide-Olodaterol (STIOLTO RESPIMAT) 2.5-2.5 MCG/ACT AERS Inhale 2 puffs into the lungs daily. 3 each 1  . Turmeric, Curcuma Longa, (CURCUMIN) POWD by Does not apply route daily.    Marland Kitchen UNABLE TO FIND daily. Med Name: META IC3    . vitamin B-12 (CYANOCOBALAMIN) 1000 MCG tablet Take 5,000 mcg by mouth.      No current facility-administered medications for  this visit.    Family History Family History  Problem Relation Age of Onset  . Thyroid disease Mother   . Varicose Veins Mother   . Congestive Heart Failure Mother   . Diabetes Father   . Heart disease Father   . Heart attack Father       Social History Social History   Tobacco Use  . Smoking status: Former Smoker    Quit date: 04/14/1999    Years since quitting: 21.2  . Smokeless tobacco: Former Systems developer    Quit date: 04/14/1999  Vaping Use  . Vaping Use: Never used  Substance Use Topics  . Alcohol use: Yes    Alcohol/week: 5.0 - 6.0 standard drinks    Types: 5 - 6 Standard drinks or equivalent per week  . Drug use: No        Review of Systems  Constitutional: Negative.   HENT: Negative.   Eyes: Negative.   Respiratory: Negative.   Cardiovascular: Negative.   Gastrointestinal: Negative.   Genitourinary: Negative.   Skin: Negative.   Neurological: Negative.   Psychiatric/Behavioral: Negative.       Physical Exam Blood pressure (!) 149/80, pulse 69, temperature 98.2 F (36.8 C), height 5\' 11"  (1.803 m), weight 207 lb (93.9 kg), SpO2 95 %. Last Weight  Most recent update: 07/20/2020  1:45 PM   Weight  93.9 kg (207 lb)  CONSTITUTIONAL: Well developed, and nourished, appropriately responsive and aware without distress.   EYES: Sclera non-icteric.   EARS, NOSE, MOUTH AND THROAT: Mask worn.   Hearing is intact to voice.  NECK: Trachea is midline, and there is no jugular venous distension.  LYMPH NODES:  Lymph nodes in the neck are not enlarged. RESPIRATORY:  Lungs are clear, and breath sounds are equal bilaterally. Normal respiratory effort without pathologic use of accessory muscles. CARDIOVASCULAR: Heart is regular in rate and rhythm. GI: The abdomen is soft, nontender, and nondistended.  There is a 1.5 cm fascial defect, with associated diastases recti.  There were no palpable masses. I did not appreciate hepatosplenomegaly. There were normal bowel  sounds. MUSCULOSKELETAL:  Symmetrical muscle tone appreciated in all four extremities.    SKIN: Skin turgor is normal. No pathologic skin lesions appreciated.  NEUROLOGIC:  Motor and sensation appear grossly normal.  Cranial nerves are grossly without defect. PSYCH:  Alert and oriented to person, place and time. Affect is appropriate for situation.  Data Reviewed I have personally reviewed what is currently available of the patient's imaging, recent labs and medical records.   Labs:  CBC Latest Ref Rng & Units 07/14/2020 03/25/2020 03/10/2019  WBC 3.4 - 10.8 x10E3/uL 5.6 6.4 5.6  Hemoglobin 13.0 - 17.7 g/dL 15.8 17.2 16.0  Hematocrit 37.5 - 51.0 % 47.4 48.0 45.5  Platelets 150 - 450 x10E3/uL 125(L) 125(L) 118(L)   CMP Latest Ref Rng & Units 07/14/2020 03/25/2020 03/10/2019  Glucose 65 - 99 mg/dL 120(H) 149(H) 138(H)  BUN 8 - 27 mg/dL 20 11 17   Creatinine 0.76 - 1.27 mg/dL 1.03 1.22 0.95  Sodium 134 - 144 mmol/L 138 141 138  Potassium 3.5 - 5.2 mmol/L 4.7 3.9 3.9  Chloride 96 - 106 mmol/L 102 98 101  CO2 20 - 29 mmol/L 24 21 22   Calcium 8.6 - 10.2 mg/dL 9.6 9.5 9.4  Total Protein 6.0 - 8.5 g/dL 7.2 8.0 7.0  Total Bilirubin 0.0 - 1.2 mg/dL 0.4 0.7 0.5  Alkaline Phos 44 - 121 IU/L 44 40(L) 36(L)  AST 0 - 40 IU/L 20 24 23   ALT 0 - 44 IU/L 19 31 31       Imaging:  Within last 24 hrs: No results found.  Assessment    Umbilical hernia Patient Active Problem List   Diagnosis Date Noted  . Family history of abdominal aortic aneurysm (AAA) repair 02/25/2018  . Low testosterone 07/09/2017  . Advanced care planning/counseling discussion   . Rectal polyp   . BPH (benign prostatic hyperplasia) 12/27/2016  . Type 2 diabetes mellitus without complications ([place]) 29/52/8413  . Impotence due to erectile dysfunction 12/27/2016  . Lower leg edema 10/24/2016  . Shortness of breath 05/14/2014  . Fatigue 05/14/2014    Plan    We discussed options of open primary repair versus, open repair  with mesh, versus laparoscopic, robotic assisted umbilical hernia repair with mesh.  The pros and cons regarding the way intervention is completed, the risks and benefits including the risk of recurrence.  Recovery time, etc.  I believe he understands the choices, and has opted to proceed with direct primary repair, which is what I would recommend in this quite fit gentleman with limited size of defect. I discussed possibility of incarceration, strangulation, enlargement in size over time, and the risk of emergency surgery in the face of strangulation.  Also discussed the risk of surgery including recurrence which can be up to 30% in the case of  complex hernias, use of prosthetic materials (mesh) and the increased risk of infxn, post-op infxn and the possible need for re-operation and removal of mesh if used, possibility of post-op SBO or ileus, and the risks of general anesthetic including MI, CVA, sudden death or even reaction to anesthetic medications. The patient and family understands the risks, any and all questions were answered to the patient's satisfaction.  Face-to-face time spent with the patient and accompanying care providers(if present) was 30 minutes, with more than 50% of the time spent counseling, educating, and coordinating care of the patient.      Ronny Bacon M.D., FACS 07/20/2020, 2:15 PM

## 2020-07-21 ENCOUNTER — Telehealth: Payer: Self-pay | Admitting: Surgery

## 2020-07-21 DIAGNOSIS — N5201 Erectile dysfunction due to arterial insufficiency: Secondary | ICD-10-CM | POA: Diagnosis not present

## 2020-07-21 DIAGNOSIS — J3489 Other specified disorders of nose and nasal sinuses: Secondary | ICD-10-CM | POA: Diagnosis not present

## 2020-07-21 DIAGNOSIS — E291 Testicular hypofunction: Secondary | ICD-10-CM | POA: Diagnosis not present

## 2020-07-21 DIAGNOSIS — J321 Chronic frontal sinusitis: Secondary | ICD-10-CM | POA: Diagnosis not present

## 2020-07-21 DIAGNOSIS — N401 Enlarged prostate with lower urinary tract symptoms: Secondary | ICD-10-CM | POA: Diagnosis not present

## 2020-07-21 DIAGNOSIS — J301 Allergic rhinitis due to pollen: Secondary | ICD-10-CM | POA: Diagnosis not present

## 2020-07-21 DIAGNOSIS — J342 Deviated nasal septum: Secondary | ICD-10-CM | POA: Diagnosis not present

## 2020-07-21 NOTE — Telephone Encounter (Signed)
Outgoing call is made, left message for patient to call.  Please advise patient of Pre-Admission date/time, COVID Testing date and Surgery date.  Surgery Date: 09/01/20 Preadmission Testing Date: 08/23/20 (phone 1p-5p) Covid Testing Date: 08/30/20 - patient advised to go to the [facility] ([place]) between 8a-1p   Also patient needs to call at [phone], between 1-3:00pm the day before surgery, to find out what time to arrive for surgery.

## 2020-07-22 NOTE — Telephone Encounter (Signed)
Patient is now informed of all dates regarding his surgery and voices understanding.

## 2020-08-16 DIAGNOSIS — J342 Deviated nasal septum: Secondary | ICD-10-CM | POA: Diagnosis not present

## 2020-08-23 ENCOUNTER — Encounter
Admission: RE | Admit: 2020-08-23 | Discharge: 2020-08-23 | Disposition: A | Discharge status: Home / Self Care | Payer: Medicare Other | Source: Ambulatory Visit | Attending: Surgery | Admitting: Surgery

## 2020-08-23 ENCOUNTER — Other Ambulatory Visit: Payer: Self-pay

## 2020-08-23 DIAGNOSIS — Z01812 Encounter for preprocedural laboratory examination: Secondary | ICD-10-CM | POA: Insufficient documentation

## 2020-08-23 HISTORY — DX: Malignant (primary) neoplasm, unspecified: C80.1

## 2020-08-23 HISTORY — DX: Prediabetes: R73.03

## 2020-08-23 NOTE — Patient Instructions (Addendum)
Your procedure is scheduled on:09-01-20 [facility] Report to the Registration Desk on the 1st floor of the [place]. To find out your arrival time, please call [phone] between 1PM - 3PM on:08-31-20 TUESDAY  REMEMBER: Instructions that are not followed completely may result in serious medical risk, up to and including death; or upon the discretion of your surgeon and anesthesiologist your surgery may need to be rescheduled.  Do not eat food after midnight the night before surgery.  No gum chewing, lozengers or hard candies.  You may however, drink CLEAR liquids up to 2 hours before you are scheduled to arrive for your surgery. Do not drink anything within 2 hours of your scheduled arrival time.  Clear liquids include: - water  - apple juice without pulp - gatorade (not RED, PURPLE, OR BLUE) - black coffee or tea (Do NOT add milk or creamers to the coffee or tea) Do NOT drink anything that is not on this list.  TAKE THESE MEDICATIONS THE MORNING OF SURGERY WITH A SIP OF WATER: -NONE  Use inhalers on the day of surgery -USE YOUR STIOLTO INHALER THE MORNING OF YOUR SURGERY  One week prior to surgery: Stop Anti-inflammatories (NSAIDS) such as Advil, Aleve, Ibuprofen, Motrin, Naproxen, Naprosyn and Aspirin based products such as Excedrin, Goodys Powder, BC Powder-OK TO TAKE TYLENOL IF NEEDED  Stop ANY OVER THE COUNTER supplements until after surgery. (However, you may continue taking Vitamin D, Zinc, and multivitamin up until the day before surgery.)  No Alcohol for 24 hours before or after surgery.  No Smoking including e-cigarettes for 24 hours prior to surgery.  No chewable tobacco products for at least 6 hours prior to surgery.  No nicotine patches on the day of surgery.  Do not use any "recreational" drugs for at least a week prior to your surgery.  Please be advised that the combination of cocaine and anesthesia may have negative outcomes, up to and including death. If  you test positive for cocaine, your surgery will be cancelled.  On the morning of surgery brush your teeth with toothpaste and water, you may rinse your mouth with mouthwash if you wish. Do not swallow any toothpaste or mouthwash.  Do not wear jewelry, make-up, hairpins, clips or nail polish.  Do not wear lotions, powders, or perfumes.   Do not shave body from the neck down 48 hours prior to surgery just in case you cut yourself which could leave a site for infection.  Also, freshly shaved skin may become irritated if using the CHG soap.  Contact lenses, hearing aids and dentures may not be worn into surgery.  Do not bring valuables to the hospital. [facility] is not responsible for any missing/lost belongings or valuables.   Use CHG Soap as directed on instruction sheet.  Bring your C-PAP to the hospital with you in case you may have to spend the night.   Notify your doctor if there is any change in your medical condition (cold, fever, infection).  Wear comfortable clothing (specific to your surgery type) to the hospital.  Plan for stool softeners for home use; pain medications have a tendency to cause constipation. You can also help prevent constipation by eating foods high in fiber such as fruits and vegetables and drinking plenty of fluids as your diet allows.  After surgery, you can help prevent lung complications by doing breathing exercises.  Take deep breaths and cough every 1-2 hours. Your doctor may order a device called an Chiropodist  to help you take deep breaths. When coughing or sneezing, hold a pillow firmly against your incision with both hands. This is called "splinting." Doing this helps protect your incision. It also decreases belly discomfort.  If you are being admitted to the hospital overnight, leave your suitcase in the car. After surgery it may be brought to your room.  If you are being discharged the day of surgery, you will not be allowed to drive  home. You will need a responsible adult (18 years or older) to drive you home and stay with you that night.   If you are taking public transportation, you will need to have a responsible adult (18 years or older) with you. Please confirm with your physician that it is acceptable to use public transportation.   Please call the [place] Dept. at [phone] if you have any questions about these instructions.  Visitation Policy:  Patients undergoing a surgery or procedure may have one family member or support person with them as long as that person is not COVID-19 positive or experiencing its symptoms.  That person may remain in the waiting area during the procedure.  Inpatient Visitation Update:   In an effort to ensure the safety of our team members and our patients, we are implementing a change to our visitation policy:  Effective Monday, Aug. 9, at 7 a.m., inpatients will be allowed one support person.  o The support person may change daily.  o The support person must pass our screening, gel in and out, and wear a mask at all times, including in the patient's room.  o Patients must also wear a mask when staff or their support person are in the room.  o Masking is required regardless of vaccination status.  Systemwide, no visitors 17 or younger.

## 2020-08-24 DIAGNOSIS — G5603 Carpal tunnel syndrome, bilateral upper limbs: Secondary | ICD-10-CM | POA: Diagnosis not present

## 2020-08-30 ENCOUNTER — Other Ambulatory Visit
Admission: RE | Admit: 2020-08-30 | Discharge: 2020-08-30 | Disposition: A | Discharge status: Home / Self Care | Payer: Medicare Other | Source: Ambulatory Visit | Attending: Surgery | Admitting: Surgery

## 2020-08-30 ENCOUNTER — Other Ambulatory Visit: Payer: Self-pay

## 2020-08-30 DIAGNOSIS — Z20822 Contact with and (suspected) exposure to covid-19: Secondary | ICD-10-CM | POA: Insufficient documentation

## 2020-08-30 DIAGNOSIS — Z01812 Encounter for preprocedural laboratory examination: Secondary | ICD-10-CM | POA: Diagnosis not present

## 2020-08-31 ENCOUNTER — Telehealth (INDEPENDENT_AMBULATORY_CARE_PROVIDER_SITE_OTHER): Payer: Medicare Other | Admitting: Surgery

## 2020-08-31 DIAGNOSIS — K429 Umbilical hernia without obstruction or gangrene: Secondary | ICD-10-CM | POA: Diagnosis not present

## 2020-08-31 LAB — SARS CORONAVIRUS 2 (TAT 6-24 HRS): SARS Coronavirus 2: NEGATIVE

## 2020-08-31 NOTE — Progress Notes (Signed)
Patient ID: Luke Cain, male   DOB: 15-Nov-1950, 69 y.o.   MRN: [id number]  Tele Visit today confirming no changes to the H&P below.   Chief Complaint: Umbilical hernia  History of Present Illness Luke Cain is a 69 y.o. male with known umbilical hernia over the last year.  Intentional weight loss is made it more obvious through his clothing.  Although its bulging, is readily reducible.  He denies pain, but it has increased in relative size.  Past Medical History Past Medical History:  Diagnosis Date  . Arthritis    hands, knees  . Cancer ([place])    skin cancer  . Pre-diabetes    lost weight and as of 07-14-20 A1C is 5.0  . Sleep apnea    Uses Cpap      Past Surgical History:  Procedure Laterality Date  . ANAL FISSURE REPAIR     over 30 yrs ago  . COLONOSCOPY WITH PROPOFOL N/A 01/15/2017   Procedure: COLONOSCOPY WITH PROPOFOL;  Surgeon: Lucilla Lame, MD;  Location: [place];  Service: Endoscopy;  Laterality: N/A;  sleep apnea  . POLYPECTOMY  01/15/2017   Procedure: POLYPECTOMY;  Surgeon: Lucilla Lame, MD;  Location: [place];  Service: Endoscopy;;    No Known Allergies  Current Outpatient Medications  Medication Sig Dispense Refill  . alprostadil (EDEX) 40 MCG injection 40 mcg by Intracavitary route as needed for erectile dysfunction. use no more than 3 times per week (Patient not taking: Reported on 08/17/2020)    . hydroxychloroquine (PLAQUENIL) 200 MG tablet Take by mouth daily. (Patient not taking: Reported on 08/17/2020)    . Multiple Vitamin (MULTI-VITAMINS) TABS Take 1 tablet by mouth daily.     . Multiple Vitamins-Minerals (ZINC PO) Take 1 tablet by mouth daily.     Marland Kitchen testosterone enanthate (DELATESTRYL) 200 MG/ML injection Inject into the muscle every 14 (fourteen) days. For IM use only (Patient not taking: Reported on 08/17/2020)    . Tiotropium Bromide-Olodaterol (STIOLTO RESPIMAT) 2.5-2.5 MCG/ACT AERS Inhale 2 puffs into the lungs daily. 3  each 1  . triamcinolone (NASACORT ALLERGY 24HR) 55 MCG/ACT AERO nasal inhaler Place 2 sprays into the nose daily.    . Turmeric, Curcuma Longa, (CURCUMIN) POWD by Does not apply route daily. (Patient not taking: Reported on 08/17/2020)    . UNABLE TO FIND daily. Med Name: Larence Penning (Patient not taking: Reported on 08/17/2020)    . vitamin B-12 (CYANOCOBALAMIN) 1000 MCG tablet Take 5,000 mcg by mouth.  (Patient not taking: Reported on 08/17/2020)    . Vitamin D3 (VITAMIN D) 25 MCG tablet Take 1,000 Units by mouth daily.      No current facility-administered medications for this visit.    Family History Family History  Problem Relation Age of Onset  . Thyroid disease Mother   . Varicose Veins Mother   . Congestive Heart Failure Mother   . Diabetes Father   . Heart disease Father   . Heart attack Father       Social History Social History   Tobacco Use  . Smoking status: Former Smoker    Packs/day: 1.00    Years: 30.00    Pack years: 30.00    Types: Cigarettes    Quit date: 04/14/1999    Years since quitting: 21.3  . Smokeless tobacco: Former Systems developer    Quit date: 04/14/1999  Vaping Use  . Vaping Use: Never used  Substance Use Topics  . Alcohol use: Yes  Alcohol/week: 5.0 - 6.0 standard drinks    Types: 5 - 6 Standard drinks or equivalent per week  . Drug use: No        Review of Systems  Constitutional: Negative.   HENT: Negative.   Eyes: Negative.   Respiratory: Negative.   Cardiovascular: Negative.   Gastrointestinal: Negative.   Genitourinary: Negative.   Skin: Negative.   Neurological: Negative.   Psychiatric/Behavioral: Negative.       Physical Exam There were no vitals taken for this visit.   CONSTITUTIONAL: Well developed, and nourished, appropriately responsive and aware without distress.   EYES: Sclera non-icteric.   EARS, NOSE, MOUTH AND THROAT: Mask worn.   Hearing is intact to voice.  NECK: Trachea is midline, and there is no jugular venous  distension.  LYMPH NODES:  Lymph nodes in the neck are not enlarged. RESPIRATORY:  Lungs are clear, and breath sounds are equal bilaterally. Normal respiratory effort without pathologic use of accessory muscles. CARDIOVASCULAR: Heart is regular in rate and rhythm. GI: The abdomen is soft, nontender, and nondistended.  There is a 1.5 cm fascial defect, with associated diastases recti.  There were no palpable masses. I did not appreciate hepatosplenomegaly. There were normal bowel sounds. MUSCULOSKELETAL:  Symmetrical muscle tone appreciated in all four extremities.    SKIN: Skin turgor is normal. No pathologic skin lesions appreciated.  NEUROLOGIC:  Motor and sensation appear grossly normal.  Cranial nerves are grossly without defect. PSYCH:  Alert and oriented to person, place and time. Affect is appropriate for situation.  Data Reviewed I have personally reviewed what is currently available of the patient's imaging, recent labs and medical records.   Labs:  CBC Latest Ref Rng & Units 07/14/2020 03/25/2020 03/10/2019  WBC 3.4 - 10.8 x10E3/uL 5.6 6.4 5.6  Hemoglobin 13.0 - 17.7 g/dL 15.8 17.2 16.0  Hematocrit 37.5 - 51.0 % 47.4 48.0 45.5  Platelets 150 - 450 x10E3/uL 125(L) 125(L) 118(L)   CMP Latest Ref Rng & Units 07/14/2020 03/25/2020 03/10/2019  Glucose 65 - 99 mg/dL 120(H) 149(H) 138(H)  BUN 8 - 27 mg/dL 20 11 17   Creatinine 0.76 - 1.27 mg/dL 1.03 1.22 0.95  Sodium 134 - 144 mmol/L 138 141 138  Potassium 3.5 - 5.2 mmol/L 4.7 3.9 3.9  Chloride 96 - 106 mmol/L 102 98 101  CO2 20 - 29 mmol/L 24 21 22   Calcium 8.6 - 10.2 mg/dL 9.6 9.5 9.4  Total Protein 6.0 - 8.5 g/dL 7.2 8.0 7.0  Total Bilirubin 0.0 - 1.2 mg/dL 0.4 0.7 0.5  Alkaline Phos 44 - 121 IU/L 44 40(L) 36(L)  AST 0 - 40 IU/L 20 24 23   ALT 0 - 44 IU/L 19 31 31       Imaging:  Within last 24 hrs: No results found.  Assessment    Umbilical hernia Patient Active Problem List   Diagnosis Date Noted  . Umbilical hernia  without obstruction and without gangrene 07/20/2020  . Family history of abdominal aortic aneurysm (AAA) repair 02/25/2018  . Low testosterone 07/09/2017  . Advanced care planning/counseling discussion   . Rectal polyp   . BPH (benign prostatic hyperplasia) 12/27/2016  . Type 2 diabetes mellitus without complications ([place]) 97/10/6376  . Impotence due to erectile dysfunction 12/27/2016  . Lower leg edema 10/24/2016  . Shortness of breath 05/14/2014  . Fatigue 05/14/2014    Plan    We discussed options of open primary repair versus, open repair with mesh, versus laparoscopic, robotic assisted umbilical  hernia repair with mesh.  The pros and cons regarding the way intervention is completed, the risks and benefits including the risk of recurrence.  Recovery time, etc.  I believe he understands the choices, and has opted to proceed with direct primary repair, which is what I would recommend in this quite fit gentleman with limited size of defect. I discussed possibility of incarceration, strangulation, enlargement in size over time, and the risk of emergency surgery in the face of strangulation.  Also discussed the risk of surgery including recurrence which can be up to 30% in the case of complex hernias, use of prosthetic materials (mesh) and the increased risk of infxn, post-op infxn and the possible need for re-operation and removal of mesh if used, possibility of post-op SBO or ileus, and the risks of general anesthetic including MI, CVA, sudden death or even reaction to anesthetic medications. The patient and family understands the risks, any and all questions were answered to the patient's satisfaction.  Face-to-face time spent with the patient and accompanying care providers(if present) was 30 minutes, with more than 50% of the time spent counseling, educating, and coordinating care of the patient.      Ronny Bacon M.D., FACS 08/31/2020, 1:40 PM

## 2020-08-31 NOTE — H&P (View-Only) (Signed)
Patient ID: Luke Cain, male   DOB: 08/15/1951, 69 y.o.   MRN: [id number]  Tele Visit today confirming no changes to the H&P below.   Chief Complaint: Umbilical hernia  History of Present Illness Luke Cain is a 69 y.o. male with known umbilical hernia over the last year.  Intentional weight loss is made it more obvious through his clothing.  Although its bulging, is readily reducible.  He denies pain, but it has increased in relative size.  Past Medical History Past Medical History:  Diagnosis Date  . Arthritis    hands, knees  . Cancer ([place])    skin cancer  . Pre-diabetes    lost weight and as of 07-14-20 A1C is 5.0  . Sleep apnea    Uses Cpap      Past Surgical History:  Procedure Laterality Date  . ANAL FISSURE REPAIR     over 30 yrs ago  . COLONOSCOPY WITH PROPOFOL N/A 01/15/2017   Procedure: COLONOSCOPY WITH PROPOFOL;  Surgeon: Lucilla Lame, MD;  Location: [place];  Service: Endoscopy;  Laterality: N/A;  sleep apnea  . POLYPECTOMY  01/15/2017   Procedure: POLYPECTOMY;  Surgeon: Lucilla Lame, MD;  Location: [place];  Service: Endoscopy;;    No Known Allergies  Current Outpatient Medications  Medication Sig Dispense Refill  . alprostadil (EDEX) 40 MCG injection 40 mcg by Intracavitary route as needed for erectile dysfunction. use no more than 3 times per week (Patient not taking: Reported on 08/17/2020)    . hydroxychloroquine (PLAQUENIL) 200 MG tablet Take by mouth daily. (Patient not taking: Reported on 08/17/2020)    . Multiple Vitamin (MULTI-VITAMINS) TABS Take 1 tablet by mouth daily.     . Multiple Vitamins-Minerals (ZINC PO) Take 1 tablet by mouth daily.     Marland Kitchen testosterone enanthate (DELATESTRYL) 200 MG/ML injection Inject into the muscle every 14 (fourteen) days. For IM use only (Patient not taking: Reported on 08/17/2020)    . Tiotropium Bromide-Olodaterol (STIOLTO RESPIMAT) 2.5-2.5 MCG/ACT AERS Inhale 2 puffs into the lungs daily. 3  each 1  . triamcinolone (NASACORT ALLERGY 24HR) 55 MCG/ACT AERO nasal inhaler Place 2 sprays into the nose daily.    . Turmeric, Curcuma Longa, (CURCUMIN) POWD by Does not apply route daily. (Patient not taking: Reported on 08/17/2020)    . UNABLE TO FIND daily. Med Name: Larence Penning (Patient not taking: Reported on 08/17/2020)    . vitamin B-12 (CYANOCOBALAMIN) 1000 MCG tablet Take 5,000 mcg by mouth.  (Patient not taking: Reported on 08/17/2020)    . Vitamin D3 (VITAMIN D) 25 MCG tablet Take 1,000 Units by mouth daily.      No current facility-administered medications for this visit.    Family History Family History  Problem Relation Age of Onset  . Thyroid disease Mother   . Varicose Veins Mother   . Congestive Heart Failure Mother   . Diabetes Father   . Heart disease Father   . Heart attack Father       Social History Social History   Tobacco Use  . Smoking status: Former Smoker    Packs/day: 1.00    Years: 30.00    Pack years: 30.00    Types: Cigarettes    Quit date: 04/14/1999    Years since quitting: 21.3  . Smokeless tobacco: Former Systems developer    Quit date: 04/14/1999  Vaping Use  . Vaping Use: Never used  Substance Use Topics  . Alcohol use: Yes  Alcohol/week: 5.0 - 6.0 standard drinks    Types: 5 - 6 Standard drinks or equivalent per week  . Drug use: No        Review of Systems  Constitutional: Negative.   HENT: Negative.   Eyes: Negative.   Respiratory: Negative.   Cardiovascular: Negative.   Gastrointestinal: Negative.   Genitourinary: Negative.   Skin: Negative.   Neurological: Negative.   Psychiatric/Behavioral: Negative.       Physical Exam There were no vitals taken for this visit.   CONSTITUTIONAL: Well developed, and nourished, appropriately responsive and aware without distress.   EYES: Sclera non-icteric.   EARS, NOSE, MOUTH AND THROAT: Mask worn.   Hearing is intact to voice.  NECK: Trachea is midline, and there is no jugular venous  distension.  LYMPH NODES:  Lymph nodes in the neck are not enlarged. RESPIRATORY:  Lungs are clear, and breath sounds are equal bilaterally. Normal respiratory effort without pathologic use of accessory muscles. CARDIOVASCULAR: Heart is regular in rate and rhythm. GI: The abdomen is soft, nontender, and nondistended.  There is a 1.5 cm fascial defect, with associated diastases recti.  There were no palpable masses. I did not appreciate hepatosplenomegaly. There were normal bowel sounds. MUSCULOSKELETAL:  Symmetrical muscle tone appreciated in all four extremities.    SKIN: Skin turgor is normal. No pathologic skin lesions appreciated.  NEUROLOGIC:  Motor and sensation appear grossly normal.  Cranial nerves are grossly without defect. PSYCH:  Alert and oriented to person, place and time. Affect is appropriate for situation.  Data Reviewed I have personally reviewed what is currently available of the patient's imaging, recent labs and medical records.   Labs:  CBC Latest Ref Rng & Units 07/14/2020 03/25/2020 03/10/2019  WBC 3.4 - 10.8 x10E3/uL 5.6 6.4 5.6  Hemoglobin 13.0 - 17.7 g/dL 15.8 17.2 16.0  Hematocrit 37.5 - 51.0 % 47.4 48.0 45.5  Platelets 150 - 450 x10E3/uL 125(L) 125(L) 118(L)   CMP Latest Ref Rng & Units 07/14/2020 03/25/2020 03/10/2019  Glucose 65 - 99 mg/dL 120(H) 149(H) 138(H)  BUN 8 - 27 mg/dL 20 11 17   Creatinine 0.76 - 1.27 mg/dL 1.03 1.22 0.95  Sodium 134 - 144 mmol/L 138 141 138  Potassium 3.5 - 5.2 mmol/L 4.7 3.9 3.9  Chloride 96 - 106 mmol/L 102 98 101  CO2 20 - 29 mmol/L 24 21 22   Calcium 8.6 - 10.2 mg/dL 9.6 9.5 9.4  Total Protein 6.0 - 8.5 g/dL 7.2 8.0 7.0  Total Bilirubin 0.0 - 1.2 mg/dL 0.4 0.7 0.5  Alkaline Phos 44 - 121 IU/L 44 40(L) 36(L)  AST 0 - 40 IU/L 20 24 23   ALT 0 - 44 IU/L 19 31 31       Imaging:  Within last 24 hrs: No results found.  Assessment    Umbilical hernia Patient Active Problem List   Diagnosis Date Noted  . Umbilical hernia  without obstruction and without gangrene 07/20/2020  . Family history of abdominal aortic aneurysm (AAA) repair 02/25/2018  . Low testosterone 07/09/2017  . Advanced care planning/counseling discussion   . Rectal polyp   . BPH (benign prostatic hyperplasia) 12/27/2016  . Type 2 diabetes mellitus without complications ([place]) 08/67/6195  . Impotence due to erectile dysfunction 12/27/2016  . Lower leg edema 10/24/2016  . Shortness of breath 05/14/2014  . Fatigue 05/14/2014    Plan    We discussed options of open primary repair versus, open repair with mesh, versus laparoscopic, robotic assisted umbilical  hernia repair with mesh.  The pros and cons regarding the way intervention is completed, the risks and benefits including the risk of recurrence.  Recovery time, etc.  I believe he understands the choices, and has opted to proceed with direct primary repair, which is what I would recommend in this quite fit gentleman with limited size of defect. I discussed possibility of incarceration, strangulation, enlargement in size over time, and the risk of emergency surgery in the face of strangulation.  Also discussed the risk of surgery including recurrence which can be up to 30% in the case of complex hernias, use of prosthetic materials (mesh) and the increased risk of infxn, post-op infxn and the possible need for re-operation and removal of mesh if used, possibility of post-op SBO or ileus, and the risks of general anesthetic including MI, CVA, sudden death or even reaction to anesthetic medications. The patient and family understands the risks, any and all questions were answered to the patient's satisfaction.  Face-to-face time spent with the patient and accompanying care providers(if present) was 30 minutes, with more than 50% of the time spent counseling, educating, and coordinating care of the patient.      Ronny Bacon M.D., FACS 08/31/2020, 1:40 PM

## 2020-09-01 ENCOUNTER — Encounter
Admission: RE | Disposition: A | Discharge status: Home / Self Care | Payer: Self-pay | Source: Home / Self Care | Attending: Surgery

## 2020-09-01 ENCOUNTER — Ambulatory Visit: Payer: Medicare Other | Admitting: Certified Registered Nurse Anesthetist

## 2020-09-01 ENCOUNTER — Other Ambulatory Visit: Payer: Self-pay

## 2020-09-01 ENCOUNTER — Encounter: Payer: Self-pay | Admitting: Surgery

## 2020-09-01 ENCOUNTER — Ambulatory Visit
Admission: RE | Admit: 2020-09-01 | Discharge: 2020-09-01 | Disposition: A | Discharge status: Home / Self Care | Payer: Medicare Other | Attending: Surgery | Admitting: Surgery

## 2020-09-01 DIAGNOSIS — Z87891 Personal history of nicotine dependence: Secondary | ICD-10-CM | POA: Insufficient documentation

## 2020-09-01 DIAGNOSIS — Z8249 Family history of ischemic heart disease and other diseases of the circulatory system: Secondary | ICD-10-CM | POA: Diagnosis not present

## 2020-09-01 DIAGNOSIS — Z79899 Other long term (current) drug therapy: Secondary | ICD-10-CM | POA: Insufficient documentation

## 2020-09-01 DIAGNOSIS — K429 Umbilical hernia without obstruction or gangrene: Secondary | ICD-10-CM | POA: Insufficient documentation

## 2020-09-01 DIAGNOSIS — Z833 Family history of diabetes mellitus: Secondary | ICD-10-CM | POA: Diagnosis not present

## 2020-09-01 DIAGNOSIS — Z8349 Family history of other endocrine, nutritional and metabolic diseases: Secondary | ICD-10-CM | POA: Diagnosis not present

## 2020-09-01 DIAGNOSIS — E119 Type 2 diabetes mellitus without complications: Secondary | ICD-10-CM | POA: Diagnosis not present

## 2020-09-01 DIAGNOSIS — J449 Chronic obstructive pulmonary disease, unspecified: Secondary | ICD-10-CM | POA: Diagnosis not present

## 2020-09-01 DIAGNOSIS — G4733 Obstructive sleep apnea (adult) (pediatric): Secondary | ICD-10-CM | POA: Diagnosis not present

## 2020-09-01 HISTORY — PX: UMBILICAL HERNIA REPAIR: SHX196

## 2020-09-01 SURGERY — REPAIR, HERNIA, UMBILICAL, ADULT
Anesthesia: General

## 2020-09-01 MED ORDER — GABAPENTIN 300 MG PO CAPS
ORAL_CAPSULE | ORAL | Status: AC
  Administered 2020-09-01: 300 mg via ORAL
  Filled 2020-09-01: qty 1

## 2020-09-01 MED ORDER — ROCURONIUM BROMIDE 100 MG/10ML IV SOLN
INTRAVENOUS | Status: DC | PRN
  Administered 2020-09-01: 50 mg via INTRAVENOUS

## 2020-09-01 MED ORDER — LIDOCAINE HCL (CARDIAC) PF 100 MG/5ML IV SOSY
PREFILLED_SYRINGE | INTRAVENOUS | Status: DC | PRN
  Administered 2020-09-01: 100 mg via INTRAVENOUS

## 2020-09-01 MED ORDER — EPHEDRINE SULFATE 50 MG/ML IJ SOLN
INTRAMUSCULAR | Status: DC | PRN
  Administered 2020-09-01 (×3): 5 mg via INTRAVENOUS

## 2020-09-01 MED ORDER — LACTATED RINGERS IV SOLN: INTRAVENOUS | Status: DC

## 2020-09-01 MED ORDER — CHLORHEXIDINE GLUCONATE 0.12 % MT SOLN: 15.0000 mL | Freq: Once | OROMUCOSAL | Status: AC

## 2020-09-01 MED ORDER — CEFAZOLIN SODIUM-DEXTROSE 2-4 GM/100ML-% IV SOLN
2.0000 g | INTRAVENOUS | Status: AC
  Administered 2020-09-01: 2 g via INTRAVENOUS

## 2020-09-01 MED ORDER — ABDOMINAL BINDER/ELASTIC LARGE MISC: 1.0000 | 0 refills | Status: DC

## 2020-09-01 MED ORDER — LIDOCAINE HCL (PF) 2 % IJ SOLN
INTRAMUSCULAR | Status: AC
  Filled 2020-09-01: qty 5

## 2020-09-01 MED ORDER — FENTANYL CITRATE (PF) 100 MCG/2ML IJ SOLN
INTRAMUSCULAR | Status: DC | PRN
  Administered 2020-09-01 (×2): 50 ug via INTRAVENOUS

## 2020-09-01 MED ORDER — GABAPENTIN 300 MG PO CAPS: 300.0000 mg | ORAL_CAPSULE | ORAL | Status: AC

## 2020-09-01 MED ORDER — ACETAMINOPHEN 500 MG PO TABS: 1000.0000 mg | ORAL_TABLET | ORAL | Status: AC

## 2020-09-01 MED ORDER — IBUPROFEN 800 MG PO TABS: 800.0000 mg | ORAL_TABLET | Freq: Three times a day (TID) | ORAL | 0 refills | Status: DC | PRN

## 2020-09-01 MED ORDER — ONDANSETRON HCL 4 MG/2ML IJ SOLN: 4.0000 mg | Freq: Once | INTRAMUSCULAR | Status: DC | PRN

## 2020-09-01 MED ORDER — CEFAZOLIN SODIUM-DEXTROSE 2-4 GM/100ML-% IV SOLN
INTRAVENOUS | Status: AC
  Filled 2020-09-01: qty 100

## 2020-09-01 MED ORDER — FAMOTIDINE 20 MG PO TABS
ORAL_TABLET | ORAL | Status: AC
  Administered 2020-09-01: 20 mg via ORAL
  Filled 2020-09-01: qty 1

## 2020-09-01 MED ORDER — BUPIVACAINE-EPINEPHRINE (PF) 0.25% -1:200000 IJ SOLN
INTRAMUSCULAR | Status: AC
  Filled 2020-09-01: qty 30

## 2020-09-01 MED ORDER — PROPOFOL 10 MG/ML IV BOLUS
INTRAVENOUS | Status: DC | PRN
  Administered 2020-09-01: 150 mg via INTRAVENOUS
  Administered 2020-09-01: 50 mg via INTRAVENOUS

## 2020-09-01 MED ORDER — BUPIVACAINE LIPOSOME 1.3 % IJ SUSP: 20.0000 mL | Freq: Once | INTRAMUSCULAR | Status: DC

## 2020-09-01 MED ORDER — BUPIVACAINE-EPINEPHRINE 0.25% -1:200000 IJ SOLN
INTRAMUSCULAR | Status: DC | PRN
  Administered 2020-09-01: 16 mL

## 2020-09-01 MED ORDER — FENTANYL CITRATE (PF) 100 MCG/2ML IJ SOLN: 25.0000 ug | INTRAMUSCULAR | Status: DC | PRN

## 2020-09-01 MED ORDER — ONDANSETRON HCL 4 MG/2ML IJ SOLN
INTRAMUSCULAR | Status: DC | PRN
  Administered 2020-09-01: 4 mg via INTRAVENOUS

## 2020-09-01 MED ORDER — DEXAMETHASONE SODIUM PHOSPHATE 10 MG/ML IJ SOLN
INTRAMUSCULAR | Status: DC | PRN
  Administered 2020-09-01: 10 mg via INTRAVENOUS

## 2020-09-01 MED ORDER — DEXAMETHASONE SODIUM PHOSPHATE 10 MG/ML IJ SOLN
INTRAMUSCULAR | Status: AC
  Filled 2020-09-01: qty 1

## 2020-09-01 MED ORDER — CELECOXIB 200 MG PO CAPS: 200.0000 mg | ORAL_CAPSULE | ORAL | Status: AC

## 2020-09-01 MED ORDER — PHENYLEPHRINE HCL (PRESSORS) 10 MG/ML IV SOLN
INTRAVENOUS | Status: AC
  Filled 2020-09-01: qty 1

## 2020-09-01 MED ORDER — CHLORHEXIDINE GLUCONATE 0.12 % MT SOLN
OROMUCOSAL | Status: AC
  Administered 2020-09-01: 15 mL via OROMUCOSAL
  Filled 2020-09-01: qty 15

## 2020-09-01 MED ORDER — ORAL CARE MOUTH RINSE: 15.0000 mL | Freq: Once | OROMUCOSAL | Status: AC

## 2020-09-01 MED ORDER — ONDANSETRON HCL 4 MG/2ML IJ SOLN
INTRAMUSCULAR | Status: AC
  Filled 2020-09-01: qty 2

## 2020-09-01 MED ORDER — CELECOXIB 200 MG PO CAPS
ORAL_CAPSULE | ORAL | Status: AC
  Administered 2020-09-01: 200 mg via ORAL
  Filled 2020-09-01: qty 1

## 2020-09-01 MED ORDER — FAMOTIDINE 20 MG PO TABS: 20.0000 mg | ORAL_TABLET | Freq: Once | ORAL | Status: AC

## 2020-09-01 MED ORDER — CHLORHEXIDINE GLUCONATE CLOTH 2 % EX PADS
6.0000 | MEDICATED_PAD | Freq: Once | CUTANEOUS | Status: AC
  Administered 2020-09-01: 6 via TOPICAL

## 2020-09-01 MED ORDER — MIDAZOLAM HCL 2 MG/2ML IJ SOLN
INTRAMUSCULAR | Status: AC
  Filled 2020-09-01: qty 2

## 2020-09-01 MED ORDER — ACETAMINOPHEN 500 MG PO TABS
ORAL_TABLET | ORAL | Status: AC
  Administered 2020-09-01: 1000 mg via ORAL
  Filled 2020-09-01: qty 2

## 2020-09-01 MED ORDER — HYDROCODONE-ACETAMINOPHEN 5-325 MG PO TABS: 1.0000 | ORAL_TABLET | Freq: Four times a day (QID) | ORAL | 0 refills | Status: DC | PRN

## 2020-09-01 MED ORDER — EPHEDRINE 5 MG/ML INJ
INTRAVENOUS | Status: AC
  Filled 2020-09-01: qty 10

## 2020-09-01 MED ORDER — PROPOFOL 10 MG/ML IV BOLUS
INTRAVENOUS | Status: AC
  Filled 2020-09-01: qty 20

## 2020-09-01 MED ORDER — FENTANYL CITRATE (PF) 100 MCG/2ML IJ SOLN
INTRAMUSCULAR | Status: AC
  Filled 2020-09-01: qty 2

## 2020-09-01 MED ORDER — SUGAMMADEX SODIUM 200 MG/2ML IV SOLN
INTRAVENOUS | Status: DC | PRN
  Administered 2020-09-01: 200 mg via INTRAVENOUS

## 2020-09-01 SURGICAL SUPPLY — 31 items
ADH SKN CLS APL DERMABOND .7 (GAUZE/BANDAGES/DRESSINGS) ×1
APL PRP STRL LF DISP 70% ISPRP (MISCELLANEOUS) ×1
BINDER ABDOMINAL 12 ML 46-62 (SOFTGOODS) ×3 IMPLANT
BLADE CLIPPER SURG (BLADE) IMPLANT
BLADE SURG 15 STRL LF DISP TIS (BLADE) ×1 IMPLANT
BLADE SURG 15 STRL SS (BLADE) ×3
CANISTER SUCT 1200ML W/VALVE (MISCELLANEOUS) IMPLANT
CHLORAPREP W/TINT 26 (MISCELLANEOUS) ×3 IMPLANT
COVER WAND RF STERILE (DRAPES) ×3 IMPLANT
DECANTER SPIKE VIAL GLASS SM (MISCELLANEOUS) ×3 IMPLANT
DERMABOND ADVANCED (GAUZE/BANDAGES/DRESSINGS) ×2
DERMABOND ADVANCED .7 DNX12 (GAUZE/BANDAGES/DRESSINGS) ×1 IMPLANT
DRAPE LAPAROTOMY 77X122 PED (DRAPES) ×3 IMPLANT
ELECT CAUTERY BLADE 6.4 (BLADE) ×3 IMPLANT
ELECT REM PT RETURN 9FT ADLT (ELECTROSURGICAL) ×3
ELECTRODE REM PT RTRN 9FT ADLT (ELECTROSURGICAL) ×1 IMPLANT
GLOVE ORTHO TXT STRL SZ7.5 (GLOVE) ×3 IMPLANT
GOWN STRL REUS W/ TWL LRG LVL3 (GOWN DISPOSABLE) ×2 IMPLANT
GOWN STRL REUS W/TWL LRG LVL3 (GOWN DISPOSABLE) ×6
KIT TURNOVER KIT A (KITS) ×3 IMPLANT
MANIFOLD NEPTUNE II (INSTRUMENTS) ×3 IMPLANT
NEEDLE HYPO 22GX1.5 SAFETY (NEEDLE) ×3 IMPLANT
NS IRRIG 500ML POUR BTL (IV SOLUTION) ×3 IMPLANT
PACK BASIN MINOR (MISCELLANEOUS) ×3 IMPLANT
SUT ETHIBOND 0 MO6 C/R (SUTURE) ×3 IMPLANT
SUT MNCRL 4-0 (SUTURE) ×3
SUT MNCRL 4-0 27XMFL (SUTURE) ×1
SUT VIC AB 3-0 SH 27 (SUTURE) ×3
SUT VIC AB 3-0 SH 27X BRD (SUTURE) ×1 IMPLANT
SUTURE MNCRL 4-0 27XMF (SUTURE) ×1 IMPLANT
SYR 10ML LL (SYRINGE) ×3 IMPLANT

## 2020-09-01 NOTE — Anesthesia Preprocedure Evaluation (Signed)
Anesthesia Evaluation  Patient identified by MRN, date of birth, ID band Patient awake    Reviewed: Allergy & Precautions, NPO status , Patient's Chart, lab work & pertinent test results  History of Anesthesia Complications Negative for: history of anesthetic complications  Airway Mallampati: II       Dental   Pulmonary sleep apnea and Continuous Positive Airway Pressure Ventilation , COPD,  COPD inhaler, Not current smoker, former smoker,           Cardiovascular (-) hypertension(-) Past MI and (-) CHF (-) dysrhythmias (-) Valvular Problems/Murmurs     Neuro/Psych neg Seizures    GI/Hepatic Neg liver ROS, neg GERD  ,  Endo/Other  diabetes (hx, resolved)  Renal/GU      Musculoskeletal   Abdominal   Peds  Hematology   Anesthesia Other Findings   Reproductive/Obstetrics                             Anesthesia Physical Anesthesia Plan  ASA: III  Anesthesia Plan: General   Post-op Pain Management:    Induction: Intravenous  PONV Risk Score and Plan: 2 and Ondansetron and Dexamethasone  Airway Management Planned: Oral ETT  Additional Equipment:   Intra-op Plan:   Post-operative Plan:   Informed Consent: I have reviewed the patients History and Physical, chart, labs and discussed the procedure including the risks, benefits and alternatives for the proposed anesthesia with the patient or authorized representative who has indicated his/her understanding and acceptance.       Plan Discussed with:   Anesthesia Plan Comments:         Anesthesia Quick Evaluation

## 2020-09-01 NOTE — Transfer of Care (Signed)
Immediate Anesthesia Transfer of Care Note  Patient: Luke Cain  Procedure(s) Performed: HERNIA REPAIR UMBILICAL ADULT (N/A )  Patient Location: PACU  Anesthesia Type:General  Level of Consciousness: drowsy  Airway & Oxygen Therapy: Patient Spontanous Breathing and Patient connected to face mask oxygen  Post-op Assessment: Report given to RN and Post -op Vital signs reviewed and stable  Post vital signs: Reviewed and stable  Last Vitals:  Vitals Value Taken Time  BP 148/93 09/01/20 0830  Temp    Pulse 85 09/01/20 0832  Resp 17 09/01/20 0832  SpO2 100 % 09/01/20 0832  Vitals shown include unvalidated device data.  Last Pain:  Vitals:   09/01/20 0621  TempSrc: Tympanic  PainSc: 0-No pain         Complications: No complications documented.

## 2020-09-01 NOTE — Addendum Note (Signed)
Addendum  created 09/01/20 1106 by Tollie Eth, CRNA   Charge Capture section accepted

## 2020-09-01 NOTE — Discharge Instructions (Signed)

## 2020-09-01 NOTE — Anesthesia Procedure Notes (Signed)
Procedure Name: Intubation Date/Time: 09/01/2020 7:39 AM Performed by: Tollie Eth, CRNA Pre-anesthesia Checklist: Patient identified, Patient being monitored, Timeout performed, Emergency Drugs available and Suction available Patient Re-evaluated:Patient Re-evaluated prior to induction Oxygen Delivery Method: Circle system utilized Preoxygenation: Pre-oxygenation with 100% oxygen Induction Type: IV induction Ventilation: Mask ventilation without difficulty and Oral airway inserted - appropriate to patient size Laryngoscope Size: McGraph and 4 Grade View: Grade I Tube type: Oral Tube size: 7.5 mm Number of attempts: 1 Airway Equipment and Method: Stylet Placement Confirmation: ETT inserted through vocal cords under direct vision,  positive ETCO2 and breath sounds checked- equal and bilateral Secured at: 22 cm Tube secured with: Tape Dental Injury: Teeth and Oropharynx as per pre-operative assessment

## 2020-09-01 NOTE — Op Note (Signed)
Umbilical Hernia Repair  Pre-operative Diagnosis: Umbilical hernia  Post-operative Diagnosis: same  Surgeon: Ronny Bacon, MD FACS  Anesthesia: General   Findings: 1.5 cm fascial defect diameter    Estimated Blood Loss: 5 mL                 Specimens: None.         Complications: none              Procedure Details  The patient was seen again in the Holding Room. The benefits, complications, treatment options, and expected outcomes were discussed with the patient. The risks of bleeding, infection, recurrence of symptoms, failure to resolve symptoms, bowel injury, mesh placement, mesh infection, any of which could require further surgery were reviewed with the patient. The likelihood of improving the patient's symptoms with return to their baseline status is good.  The patient and/or family concurred with the proposed plan, giving informed consent.  The patient was taken to Operating Room, identified as Luke Cain and the procedure verified.  A Time Out was held and the above information confirmed.  Prior to the induction of general anesthesia, antibiotic prophylaxis was administered. VTE prophylaxis was in place. General endotracheal anesthesia was then administered and tolerated well. After the induction, the abdomen was prepped with Chloraprep and draped in the sterile fashion. The patient was positioned in the supine position.  Local infiltration with 0.25% Marcaine with epi utilized.  Incision was created with a scalpel inferior to the hernia defect. Electrocautery was used to dissect through subcutaneous tissue, the hernia sac was opened. The hernia was measured.  I closed the hernia defect with 3 interrupted 0 Ethibond sutures in a Vest-over-pants fashion.   Incision was closed in a 2 layer fashion with 3-0 Vicryl and 4-0 Monocryl. Dermabond was used to coat the skin. Marcaine quarter percent with epinephrine and lidocaine 1% was used to inject the region. Patient tolerated  procedure well and there were no immediate complications. Needle and laparotomy counts were correct   Ronny Bacon, M.D., [facility]  09/01/2020 ; 8:44 AM

## 2020-09-01 NOTE — Anesthesia Postprocedure Evaluation (Signed)
Anesthesia Post Note  Patient: Luke Cain  Procedure(s) Performed: HERNIA REPAIR UMBILICAL ADULT (N/A )  Patient location during evaluation: PACU Anesthesia Type: General Level of consciousness: awake and alert Pain management: pain level controlled Vital Signs Assessment: post-procedure vital signs reviewed and stable Respiratory status: spontaneous breathing and respiratory function stable Cardiovascular status: stable Anesthetic complications: no   No complications documented.   Last Vitals:  Vitals:   09/01/20 0900 09/01/20 0910  BP: (!) 153/97 (!) 153/88  Pulse: 68 68  Resp: 13 18  Temp: (!) 36.4 C 36.6 C  SpO2: 98% 98%    Last Pain:  Vitals:   09/01/20 0910  TempSrc: Temporal  PainSc: 2                  Haylo Fake K

## 2020-09-01 NOTE — Interval H&P Note (Signed)
History and Physical Interval Note:  09/01/2020 7:24 AM  Luke Cain  has presented today for surgery, with the diagnosis of Umbilical hernia.  The various methods of treatment have been discussed with the patient and family. After consideration of risks, benefits and other options for treatment, the patient has consented to  Procedure(s): HERNIA REPAIR UMBILICAL ADULT (N/A) as a surgical intervention.  The patient's history has been reviewed, patient examined, no change in status, stable for surgery.  I have reviewed the patient's chart and labs.  Questions were answered to the patient's satisfaction.     Ronny Bacon

## 2020-09-02 ENCOUNTER — Encounter: Payer: Self-pay | Admitting: Surgery

## 2020-09-02 DIAGNOSIS — G5601 Carpal tunnel syndrome, right upper limb: Secondary | ICD-10-CM | POA: Diagnosis not present

## 2020-09-15 DIAGNOSIS — G5601 Carpal tunnel syndrome, right upper limb: Secondary | ICD-10-CM | POA: Diagnosis not present

## 2020-09-15 HISTORY — PX: CARPAL TUNNEL RELEASE: SHX101

## 2020-09-16 ENCOUNTER — Encounter: Payer: Self-pay | Admitting: Surgery

## 2020-09-16 ENCOUNTER — Ambulatory Visit (INDEPENDENT_AMBULATORY_CARE_PROVIDER_SITE_OTHER): Payer: Medicare Other | Admitting: Surgery

## 2020-09-16 ENCOUNTER — Other Ambulatory Visit: Payer: Self-pay

## 2020-09-16 VITALS — BP 165/98 | HR 71 | Temp 97.8°F | Ht 71.0 in | Wt 223.2 lb

## 2020-09-16 DIAGNOSIS — Z09 Encounter for follow-up examination after completed treatment for conditions other than malignant neoplasm: Secondary | ICD-10-CM | POA: Insufficient documentation

## 2020-09-16 NOTE — Progress Notes (Signed)
[facility] POST-OP OFFICE VISIT  09/16/2020  HPI: Luke Cain is a 69 y.o. male 15 days s/p primary repair of umbilical fascial defect with vest overpants technique.  He has no complaints of pain.  Reports regular bowel movements, no healing issues.  Denies any fevers chills, nausea or vomiting.  He appears well and quite pleased with his repair at this time.  Just had carpal tunnel repair of his right arm.  He has been cautious with lifting, and does not appear to have any potential conflict with continuing his convalescence.  Vital signs: BP (!) 165/98   Pulse 71   Temp 97.8 F (36.6 C) (Oral)   Ht 5\' 11"  (1.803 m)   Wt 223 lb 3.2 oz (101.2 kg)   SpO2 96%   BMI 31.13 kg/m    Physical Exam: Constitutional: Appears well. Abdomen: Benign nontender.  No evidence of induration, thickening or mass. Skin: Well-healed.  Minimal residual Dermabond flaking off.  Assessment/Plan: This is a 69 y.o. male 15 days s/p umbilical hernia repair  Patient Active Problem List   Diagnosis Date Noted  . Umbilical hernia without obstruction and without gangrene 07/20/2020  . Family history of abdominal aortic aneurysm (AAA) repair 02/25/2018  . Low testosterone 07/09/2017  . Advanced care planning/counseling discussion   . Rectal polyp   . BPH (benign prostatic hyperplasia) 12/27/2016  . Type 2 diabetes mellitus without complications ([place]) 37/16/9678  . Impotence due to erectile dysfunction 12/27/2016  . Lower leg edema 10/24/2016  . Shortness of breath 05/14/2014  . Fatigue 05/14/2014    -Status post umbilical hernia repair.  Doing well.  We discussed when he may liberalize his activity and to remain attentive to discomfort when he is straining or lifting.  Be glad to see him again as needed.   Ronny Bacon M.D., FACS 09/16/2020, 9:48 AM

## 2020-09-16 NOTE — Patient Instructions (Addendum)
If you have any concerns and questions, please feel free to call our office.    GENERAL POST-OPERATIVE PATIENT INSTRUCTIONS   WOUND CARE INSTRUCTIONS:  Keep a dry clean dressing on the wound if there is drainage. The initial bandage may be removed after 24 hours.  Once the wound has quit draining you may leave it open to air.  If clothing rubs against the wound or causes irritation and the wound is not draining you may cover it with a dry dressing during the daytime.  Try to keep the wound dry and avoid ointments on the wound unless directed to do so.  If the wound becomes bright red and painful or starts to drain infected material that is not clear, please contact your physician immediately.  If the wound is mildly pink and has a thick firm ridge underneath it, this is normal, and is referred to as a healing ridge.  This will resolve over the next 4-6 weeks.  BATHING: You may shower if you have been informed of this by your surgeon. However, Please do not submerge in a tub, hot tub, or pool until incisions are completely sealed or have been told by your surgeon that you may do so.  DIET:  You may eat any foods that you can tolerate.  It is a good idea to eat a high fiber diet and take in plenty of fluids to prevent constipation.  If you do become constipated you may want to take a mild laxative or take ducolax tablets on a daily basis until your bowel habits are regular.  Constipation can be very uncomfortable, along with straining, after recent surgery.  ACTIVITY:  You are encouraged to cough and deep breath or use your incentive spirometer if you were given one, every 15-30 minutes when awake.  This will help prevent respiratory complications and low grade fevers post-operatively if you had a general anesthetic.  You may want to hug a pillow when coughing and sneezing to add additional support to the surgical area, if you had abdominal or chest surgery, which will decrease pain during these times.   You are encouraged to walk and engage in light activity for the next two weeks.  You should not lift more than 15 pounds, until 10/13/2020 as it could put you at increased risk for complications.  Twenty pounds is roughly equivalent to a plastic bag of groceries. At that time- Listen to your body when lifting, if you have pain when lifting, stop and then try again in a few days. Soreness after doing exercises or activities of daily living is normal as you get back in to your normal routine.  MEDICATIONS:  Try to take narcotic medications and anti-inflammatory medications, such as tylenol, ibuprofen, naprosyn, etc., with food.  This will minimize stomach upset from the medication.  Should you develop nausea and vomiting from the pain medication, or develop a rash, please discontinue the medication and contact your physician.  You should not drive, make important decisions, or operate machinery when taking narcotic pain medication.  SUNBLOCK Use sun block to incision area over the next year if this area will be exposed to sun. This helps decrease scarring and will allow you avoid a permanent darkened area over your incision.

## 2020-11-11 DIAGNOSIS — D2261 Melanocytic nevi of right upper limb, including shoulder: Secondary | ICD-10-CM | POA: Diagnosis not present

## 2020-11-11 DIAGNOSIS — L570 Actinic keratosis: Secondary | ICD-10-CM | POA: Diagnosis not present

## 2020-11-11 DIAGNOSIS — C44311 Basal cell carcinoma of skin of nose: Secondary | ICD-10-CM | POA: Diagnosis not present

## 2020-11-11 DIAGNOSIS — D225 Melanocytic nevi of trunk: Secondary | ICD-10-CM | POA: Diagnosis not present

## 2020-11-11 DIAGNOSIS — X32XXXA Exposure to sunlight, initial encounter: Secondary | ICD-10-CM | POA: Diagnosis not present

## 2020-11-11 DIAGNOSIS — L538 Other specified erythematous conditions: Secondary | ICD-10-CM | POA: Diagnosis not present

## 2020-11-11 DIAGNOSIS — D2262 Melanocytic nevi of left upper limb, including shoulder: Secondary | ICD-10-CM | POA: Diagnosis not present

## 2020-11-11 DIAGNOSIS — L820 Inflamed seborrheic keratosis: Secondary | ICD-10-CM | POA: Diagnosis not present

## 2020-11-11 DIAGNOSIS — Z85828 Personal history of other malignant neoplasm of skin: Secondary | ICD-10-CM | POA: Diagnosis not present

## 2020-11-11 DIAGNOSIS — D2272 Melanocytic nevi of left lower limb, including hip: Secondary | ICD-10-CM | POA: Diagnosis not present

## 2020-11-11 DIAGNOSIS — L821 Other seborrheic keratosis: Secondary | ICD-10-CM | POA: Diagnosis not present

## 2020-11-11 DIAGNOSIS — D485 Neoplasm of uncertain behavior of skin: Secondary | ICD-10-CM | POA: Diagnosis not present

## 2020-11-11 DIAGNOSIS — C44629 Squamous cell carcinoma of skin of left upper limb, including shoulder: Secondary | ICD-10-CM | POA: Diagnosis not present

## 2020-12-13 DIAGNOSIS — C44629 Squamous cell carcinoma of skin of left upper limb, including shoulder: Secondary | ICD-10-CM | POA: Diagnosis not present

## 2020-12-27 DIAGNOSIS — C44319 Basal cell carcinoma of skin of other parts of face: Secondary | ICD-10-CM | POA: Diagnosis not present

## 2020-12-27 DIAGNOSIS — C44311 Basal cell carcinoma of skin of nose: Secondary | ICD-10-CM | POA: Diagnosis not present

## 2021-01-12 ENCOUNTER — Other Ambulatory Visit: Payer: Self-pay

## 2021-01-12 ENCOUNTER — Ambulatory Visit (INDEPENDENT_AMBULATORY_CARE_PROVIDER_SITE_OTHER): Payer: Medicare Other | Admitting: Family Medicine

## 2021-01-12 ENCOUNTER — Encounter: Payer: Self-pay | Admitting: Family Medicine

## 2021-01-12 VITALS — BP 138/85 | HR 67 | Temp 98.2°F | Ht 71.25 in | Wt 216.2 lb

## 2021-01-12 DIAGNOSIS — N400 Enlarged prostate without lower urinary tract symptoms: Secondary | ICD-10-CM | POA: Diagnosis not present

## 2021-01-12 DIAGNOSIS — E119 Type 2 diabetes mellitus without complications: Secondary | ICD-10-CM

## 2021-01-12 DIAGNOSIS — R7989 Other specified abnormal findings of blood chemistry: Secondary | ICD-10-CM | POA: Diagnosis not present

## 2021-01-12 LAB — MICROALBUMIN, URINE WAIVED
Creatinine, Urine Waived: 50 mg/dL (ref 10–300)
Microalb, Ur Waived: 10 mg/L (ref 0–19)
Microalb/Creat Ratio: <30 mg/g (ref <30)

## 2021-01-12 LAB — BAYER DCA HB A1C WAIVED: HB A1C (BAYER DCA - WAIVED): 5.6 % (ref <7.0)

## 2021-01-12 LAB — URINALYSIS, ROUTINE W REFLEX MICROSCOPIC
Bilirubin, UA: NEGATIVE
Glucose, UA: NEGATIVE
Ketones, UA: NEGATIVE
Leukocytes,UA: NEGATIVE
Nitrite, UA: NEGATIVE
Protein,UA: NEGATIVE
RBC, UA: NEGATIVE
Specific Gravity, UA: <1.005 — ABNORMAL LOW (ref 1.005–1.030)
Urobilinogen, Ur: 0.2 mg/dL (ref 0.2–1.0)
pH, UA: 6.5 (ref 5.0–7.5)

## 2021-01-12 MED ORDER — STIOLTO RESPIMAT 2.5-2.5 MCG/ACT IN AERS
2.0000 | INHALATION_SPRAY | Freq: Every day | RESPIRATORY_TRACT | 1 refills | Status: DC
Start: 2021-01-12 — End: 2021-08-03

## 2021-01-12 NOTE — Patient Instructions (Signed)
Health Maintenance After Age 69 After age 69, you are at a higher risk for certain long-term diseases and infections as well as injuries from falls. Falls are a major cause of broken bones and head injuries in people who are older than age 69. Getting regular preventive care can help to keep you healthy and well. Preventive care includes getting regular testing and making lifestyle changes as recommended by your health care provider. Talk with your health care provider about:  Which screenings and tests you should have. A screening is a test that checks for a disease when you have no symptoms.  A diet and exercise plan that is right for you. What should I know about screenings and tests to prevent falls? Screening and testing are the best ways to find a health problem early. Early diagnosis and treatment give you the best chance of managing medical conditions that are common after age 69. Certain conditions and lifestyle choices may make you more likely to have a fall. Your health care provider may recommend:  Regular vision checks. Poor vision and conditions such as cataracts can make you more likely to have a fall. If you wear glasses, make sure to get your prescription updated if your vision changes.  Medicine review. Work with your health care provider to regularly review all of the medicines you are taking, including over-the-counter medicines. Ask your health care provider about any side effects that may make you more likely to have a fall. Tell your health care provider if any medicines that you take make you feel dizzy or sleepy.  Osteoporosis screening. Osteoporosis is a condition that causes the bones to get weaker. This can make the bones weak and cause them to break more easily.  Blood pressure screening. Blood pressure changes and medicines to control blood pressure can make you feel dizzy.  Strength and balance checks. Your health care provider may recommend certain tests to check your  strength and balance while standing, walking, or changing positions.  Foot health exam. Foot pain and numbness, as well as not wearing proper footwear, can make you more likely to have a fall.  Depression screening. You may be more likely to have a fall if you have a fear of falling, feel emotionally low, or feel unable to do activities that you used to do.  Alcohol use screening. Using too much alcohol can affect your balance and may make you more likely to have a fall. What actions can I take to lower my risk of falls? General instructions  Talk with your health care provider about your risks for falling. Tell your health care provider if: ? You fall. Be sure to tell your health care provider about all falls, even ones that seem minor. ? You feel dizzy, sleepy, or off-balance.  Take over-the-counter and prescription medicines only as told by your health care provider. These include any supplements.  Eat a healthy diet and maintain a healthy weight. A healthy diet includes low-fat dairy products, low-fat (lean) meats, and fiber from whole grains, beans, and lots of fruits and vegetables. Home safety  Remove any tripping hazards, such as rugs, cords, and clutter.  Install safety equipment such as grab bars in bathrooms and safety rails on stairs.  Keep rooms and walkways well-lit. Activity  Follow a regular exercise program to stay fit. This will help you maintain your balance. Ask your health care provider what types of exercise are appropriate for you.  If you need a cane or walker,   use it as recommended by your health care provider.  Wear supportive shoes that have nonskid soles.   Lifestyle  Do not drink alcohol if your health care provider tells you not to drink.  If you drink alcohol, limit how much you have: ? 0-1 drink a day for women. ? 0-2 drinks a day for men.  Be aware of how much alcohol is in your drink. In the [place] one drink equals one typical bottle of beer (12  oz), one-half glass of wine (5 oz), or one shot of hard liquor (1 oz).  Do not use any products that contain nicotine or tobacco, such as cigarettes and e-cigarettes. If you need help quitting, ask your health care provider. Summary  Having a healthy lifestyle and getting preventive care can help to protect your health and wellness after age 69.  Screening and testing are the best way to find a health problem early and help you avoid having a fall. Early diagnosis and treatment give you the best chance for managing medical conditions that are more common for people who are older than age 69.  Falls are a major cause of broken bones and head injuries in people who are older than age 69. Take precautions to prevent a fall at home.  Work with your health care provider to learn what changes you can make to improve your health and wellness and to prevent falls. This information is not intended to replace advice given to you by your health care provider. Make sure you discuss any questions you have with your health care provider. Document Revised: 01/02/2019 Document Reviewed: 07/25/2017 [employer] Patient Education  2021 [employer]  

## 2021-01-12 NOTE — Progress Notes (Signed)
BP 138/85   Pulse 67   Temp 98.2 F (36.8 C)   Ht 5' 11.25" (1.81 m)   Wt 216 lb 3.2 oz (98.1 kg)   SpO2 96%   BMI 29.94 kg/m    Subjective:    Patient ID: Luke Cain, male    DOB: 02-19-1951, 70 y.o.   MRN: [id number]  HPI: Luke Cain is a 70 y.o. male presenting on 01/12/2021 for comprehensive medical examination. Current medical complaints include:  DIABETES Hypoglycemic episodes:no Polydipsia/polyuria: no Visual disturbance: no Chest pain: no Paresthesias: no Glucose Monitoring: yes  Accucheck frequency: 3-4 days   Fasting: 100-125 Taking Insulin?: no Blood Pressure Monitoring: not checking Retinal Examination: Up to Date Foot Exam: Up to Date Diabetic Education: Completed Pneumovax: Up to Date Influenza: Up to Date Aspirin: yes  BPH BPH status: stable Satisfied with current treatment?: yes Medication side effects: not on anything Duration: chronic Nocturia: 1/night Urinary frequency:no Incomplete voiding: yes Urgency: yes Weak urinary stream: yes Straining to start stream: no Dysuria: no Onset: gradual Severity: mild  He currently lives with: wife Interim Problems from his last visit: no  Depression Screen done today and results listed below:  Depression screen [facility] 2/9 01/12/2021 07/14/2020 04/02/2020 03/25/2020 03/12/2019  Decreased Interest 0 0 0 0 0  Down, Depressed, Hopeless 0 0 0 0 0  PHQ - 2 Score 0 0 0 0 0  Altered sleeping - 0 - 0 1  Tired, decreased energy - 0 - 1 0  Change in appetite - 0 - 1 -  Feeling bad or failure about yourself  - 0 - 0 1  Trouble concentrating - 0 - 0 0  Moving slowly or fidgety/restless - 0 - 0 0  Suicidal thoughts - 0 - 0 0  PHQ-9 Score - 0 - 2 2  Difficult doing work/chores - Not difficult at all - - -     Past Medical History:  Past Medical History:  Diagnosis Date  . Arthritis    hands, knees  . Cancer ([place])    skin cancer  . Pre-diabetes    lost weight and as of 07-14-20 A1C is 5.0  . Sleep  apnea    Uses Cpap    Surgical History:  Past Surgical History:  Procedure Laterality Date  . ANAL FISSURE REPAIR     over 30 yrs ago  . CARPAL TUNNEL RELEASE  09/15/2020  . COLONOSCOPY WITH PROPOFOL N/A 01/15/2017   Procedure: COLONOSCOPY WITH PROPOFOL;  Surgeon: Lucilla Lame, MD;  Location: [place];  Service: Endoscopy;  Laterality: N/A;  sleep apnea  . POLYPECTOMY  01/15/2017   Procedure: POLYPECTOMY;  Surgeon: Lucilla Lame, MD;  Location: [place];  Service: Endoscopy;;  . UMBILICAL HERNIA REPAIR N/A 09/01/2020   Procedure: HERNIA REPAIR UMBILICAL ADULT;  Surgeon: Ronny Bacon, MD;  Location: [facility];  Service: General;  Laterality: N/A;    Medications:  Current Outpatient Medications on File Prior to Visit  Medication Sig  . Multiple Vitamin (MULTI-VITAMINS) TABS Take 1 tablet by mouth daily.   . Multiple Vitamins-Minerals (ZINC PO) Take 1 tablet by mouth daily.   . Vitamin D3 (VITAMIN D) 25 MCG tablet Take 1,000 Units by mouth daily.    No current facility-administered medications on file prior to visit.    Allergies:  No Known Allergies  Social History:  Social History   Socioeconomic History  . Marital status: Married    Spouse name: Not on file  .  Number of children: Not on file  . Years of education: Not on file  . Highest education level: Not on file  Occupational History  . Occupation: semi retired  Tobacco Use  . Smoking status: Former Smoker    Packs/day: 1.00    Years: 30.00    Pack years: 30.00    Types: Cigarettes    Quit date: 04/14/1999    Years since quitting: 21.7  . Smokeless tobacco: Former Systems developer    Quit date: 04/14/1999  Vaping Use  . Vaping Use: Never used  Substance and Sexual Activity  . Alcohol use: Yes    Alcohol/week: 5.0 - 6.0 standard drinks    Types: 5 - 6 Standard drinks or equivalent per week  . Drug use: No  . Sexual activity: Not on file  Other Topics Concern  . Not on file  Social History Narrative   . Not on file   Social Determinants of Health   Financial Resource Strain: Low Risk   . Difficulty of Paying Living Expenses: Not hard at all  Food Insecurity: No Food Insecurity  . Worried About Charity fundraiser in the Last Year: Never true  . Ran Out of Food in the Last Year: Never true  Transportation Needs: No Transportation Needs  . Lack of Transportation (Medical): No  . Lack of Transportation (Non-Medical): No  Physical Activity: Insufficiently Active  . Days of Exercise per Week: 2 days  . Minutes of Exercise per Session: 30 min  Stress: No Stress Concern Present  . Feeling of Stress : Not at all  Social Connections: Not on file  Intimate Partner Violence: Not on file   Social History   Tobacco Use  Smoking Status Former Smoker  . Packs/day: 1.00  . Years: 30.00  . Pack years: 30.00  . Types: Cigarettes  . Quit date: 04/14/1999  . Years since quitting: 21.7  Smokeless Tobacco Former Systems developer  . Quit date: 04/14/1999   Social History   Substance and Sexual Activity  Alcohol Use Yes  . Alcohol/week: 5.0 - 6.0 standard drinks  . Types: 5 - 6 Standard drinks or equivalent per week    Family History:  Family History  Problem Relation Age of Onset  . Thyroid disease Mother   . Varicose Veins Mother   . Congestive Heart Failure Mother   . Diabetes Father   . Heart disease Father   . Heart attack Father     Past medical history, surgical history, medications, allergies, family history and social history reviewed with patient today and changes made to appropriate areas of the chart.   Review of Systems  Constitutional: Negative.   HENT: Positive for hearing loss. Negative for congestion, ear discharge, ear pain, nosebleeds, sinus pain, sore throat and tinnitus.   Eyes: Negative.   Respiratory: Negative.  Negative for stridor.   Cardiovascular: Negative.   Gastrointestinal: Negative.   Genitourinary: Negative.   Musculoskeletal: Negative.   Skin: Negative.    Neurological: Negative.   Endo/Heme/Allergies: Positive for environmental allergies. Negative for polydipsia. Bruises/bleeds easily.  Psychiatric/Behavioral: Negative.    All other ROS negative except what is listed above and in the HPI.      Objective:    BP 138/85   Pulse 67   Temp 98.2 F (36.8 C)   Ht 5' 11.25" (1.81 m)   Wt 216 lb 3.2 oz (98.1 kg)   SpO2 96%   BMI 29.94 kg/m   Wt Readings from Last 3 Encounters:  01/12/21 216 lb 3.2 oz (98.1 kg)  09/16/20 223 lb 3.2 oz (101.2 kg)  09/01/20 210 lb (95.3 kg)    Physical Exam Vitals and nursing note reviewed.  Constitutional:      General: He is not in acute distress.    Appearance: Normal appearance. He is normal weight. He is not ill-appearing, toxic-appearing or diaphoretic.  HENT:     Head: Normocephalic and atraumatic.     Right Ear: Tympanic membrane, ear canal and external ear normal. There is no impacted cerumen.     Left Ear: Tympanic membrane, ear canal and external ear normal. There is no impacted cerumen.     Nose: Nose normal. No congestion or rhinorrhea.     Mouth/Throat:     Mouth: Mucous membranes are moist.     Pharynx: Oropharynx is clear. No oropharyngeal exudate or posterior oropharyngeal erythema.  Eyes:     General: No scleral icterus.       Right eye: No discharge.        Left eye: No discharge.     Extraocular Movements: Extraocular movements intact.     Conjunctiva/sclera: Conjunctivae normal.     Pupils: Pupils are equal, round, and reactive to light.  Neck:     Vascular: No carotid bruit.  Cardiovascular:     Rate and Rhythm: Normal rate and regular rhythm.     Pulses: Normal pulses.     Heart sounds: No murmur heard. No friction rub. No gallop.   Pulmonary:     Effort: Pulmonary effort is normal. No respiratory distress.     Breath sounds: Normal breath sounds. No stridor. No wheezing, rhonchi or rales.  Chest:     Chest wall: No tenderness.  Abdominal:     General: Abdomen is  flat. Bowel sounds are normal. There is no distension.     Palpations: Abdomen is soft. There is no mass.     Tenderness: There is no abdominal tenderness. There is no right CVA tenderness, left CVA tenderness, guarding or rebound.     Hernia: No hernia is present.  Genitourinary:    Comments: Genital exam deferred with shared decision making Musculoskeletal:        General: No swelling, tenderness, deformity or signs of injury.     Cervical back: Normal range of motion and neck supple. No rigidity. No muscular tenderness.     Right lower leg: No edema.     Left lower leg: No edema.  Lymphadenopathy:     Cervical: No cervical adenopathy.  Skin:    General: Skin is warm and dry.     Capillary Refill: Capillary refill takes less than 2 seconds.     Coloration: Skin is not jaundiced or pale.     Findings: No bruising, erythema, lesion or rash.  Neurological:     General: No focal deficit present.     Mental Status: He is alert and oriented to person, place, and time.     Cranial Nerves: No cranial nerve deficit.     Sensory: No sensory deficit.     Motor: No weakness.     Coordination: Coordination normal.     Gait: Gait normal.     Deep Tendon Reflexes: Reflexes normal.  Psychiatric:        Mood and Affect: Mood normal.        Behavior: Behavior normal.        Thought Content: Thought content normal.        Judgment: Judgment  normal.     Results for orders placed or performed in visit on 01/12/21  Comprehensive metabolic panel  Result Value Ref Range   Glucose 120 (H) 65 - 99 mg/dL   BUN 19 8 - 27 mg/dL   Creatinine, Ser 1.08 0.76 - 1.27 mg/dL   eGFR 75 >59 mL/min/1.73   BUN/Creatinine Ratio 18 10 - 24   Sodium 137 134 - 144 mmol/L   Potassium 4.2 3.5 - 5.2 mmol/L   Chloride 100 96 - 106 mmol/L   CO2 20 20 - 29 mmol/L   Calcium 9.5 8.6 - 10.2 mg/dL   Total Protein 7.5 6.0 - 8.5 g/dL   Albumin 4.6 3.8 - 4.8 g/dL   Globulin, Total 2.9 1.5 - 4.5 g/dL   Albumin/Globulin  Ratio 1.6 1.2 - 2.2   Bilirubin Total 0.5 0.0 - 1.2 mg/dL   Alkaline Phosphatase 37 (L) 44 - 121 IU/L   AST 20 0 - 40 IU/L   ALT 22 0 - 44 IU/L  CBC with Differential/Platelet  Result Value Ref Range   WBC 4.4 3.4 - 10.8 x10E3/uL   RBC 4.65 4.14 - 5.80 x10E6/uL   Hemoglobin 15.1 13.0 - 17.7 g/dL   Hematocrit 43.1 37.5 - 51.0 %   MCV 93 79 - 97 fL   MCH 32.5 26.6 - 33.0 pg   MCHC 35.0 31.5 - 35.7 g/dL   RDW 12.2 11.6 - 15.4 %   Platelets 125 (L) 150 - 450 x10E3/uL   Neutrophils 66 Not Estab. %   Lymphs 25 Not Estab. %   Monocytes 6 Not Estab. %   Eos 2 Not Estab. %   Basos 1 Not Estab. %   Neutrophils Absolute 2.9 1.4 - 7.0 x10E3/uL   Lymphocytes Absolute 1.1 0.7 - 3.1 x10E3/uL   Monocytes Absolute 0.3 0.1 - 0.9 x10E3/uL   EOS (ABSOLUTE) 0.1 0.0 - 0.4 x10E3/uL   Basophils Absolute 0.0 0.0 - 0.2 x10E3/uL   Immature Granulocytes 0 Not Estab. %   Immature Grans (Abs) 0.0 0.0 - 0.1 x10E3/uL  Lipid Panel w/o Chol/HDL Ratio  Result Value Ref Range   Cholesterol, Total 170 100 - 199 mg/dL   Triglycerides 68 0 - 149 mg/dL   HDL 31 (L) >39 mg/dL   VLDL Cholesterol Cal 13 5 - 40 mg/dL   LDL Chol Calc (NIH) 126 (H) 0 - 99 mg/dL  PSA  Result Value Ref Range   Prostate Specific Ag, Serum 0.6 0.0 - 4.0 ng/mL  TSH  Result Value Ref Range   TSH 1.780 0.450 - 4.500 uIU/mL  Urinalysis, Routine w reflex microscopic  Result Value Ref Range   Specific Gravity, UA <1.005 (L) 1.005 - 1.030   pH, UA 6.5 5.0 - 7.5   Color, UA Yellow Yellow   Appearance Ur Clear Clear   Leukocytes,UA Negative Negative   Protein,UA Negative Negative/Trace   Glucose, UA Negative Negative   Ketones, UA Negative Negative   RBC, UA Negative Negative   Bilirubin, UA Negative Negative   Urobilinogen, Ur 0.2 0.2 - 1.0 mg/dL   Nitrite, UA Negative Negative  Microalbumin, Urine Waived  Result Value Ref Range   Microalb, Ur Waived 10 0 - 19 mg/L   Creatinine, Urine Waived 50 10 - 300 mg/dL   Microalb/Creat Ratio  <30 <30 mg/g  Bayer DCA Hb A1c Waived  Result Value Ref Range   HB A1C (BAYER DCA - WAIVED) 5.6 <7.0 %  Testosterone, free, total([facility])  Result Value  Ref Range   Testosterone >1500 (H) 264 - 916 ng/dL   Testosterone, Free 22.9 (H) 6.6 - 18.1 pg/mL   Sex Hormone Binding 53.2 19.3 - 76.4 nmol/L      Assessment & Plan:   Problem List Items Addressed This Visit      Endocrine   Type 2 diabetes mellitus without complications ([place]) - Primary    Under good control with A1c of 5.6- will continue diet and exercise. Call with any concerns. Continue to monitor.       Relevant Orders   Comprehensive metabolic panel (Completed)   CBC with Differential/Platelet (Completed)   Lipid Panel w/o Chol/HDL Ratio (Completed)   TSH (Completed)   Urinalysis, Routine w reflex microscopic (Completed)   Microalbumin, Urine Waived (Completed)   Bayer DCA Hb A1c Waived (Completed)   Ambulatory referral to Ophthalmology     Genitourinary   BPH (benign prostatic hyperplasia)    Under good control on current regimen. Continue current regimen. Continue to monitor. Call with any concerns. Labs drawn today.       Relevant Orders   Comprehensive metabolic panel (Completed)   CBC with Differential/Platelet (Completed)   PSA (Completed)   TSH (Completed)   Urinalysis, Routine w reflex microscopic (Completed)     Other   Low testosterone    Managed by urology/functional medicine. Labs drawn today. Will send them a copy of the labs. Await results.       Relevant Orders   Comprehensive metabolic panel (Completed)   CBC with Differential/Platelet (Completed)   TSH (Completed)   Testosterone, free, total([facility]) (Completed)       Discussed aspirin prophylaxis for myocardial infarction prevention and decision was it was not indicated  LABORATORY TESTING:  Health maintenance labs ordered today as discussed above.   The natural history of prostate cancer and ongoing controversy  regarding screening and potential treatment outcomes of prostate cancer has been discussed with the patient. The meaning of a false positive PSA and a false negative PSA has been discussed. He indicates understanding of the limitations of this screening test and wishes to proceed with screening PSA testing.   IMMUNIZATIONS:   - Tdap: Tetanus vaccination status reviewed: last tetanus booster within 10 years. - Influenza: Up to date - Pneumovax: Up to date - Prevnar: Up to date - COVID: Up to date  SCREENING: - Colonoscopy: Up to date  Discussed with patient purpose of the colonoscopy is to detect colon cancer at curable precancerous or early stages     PATIENT COUNSELING:    Sexuality: Discussed sexually transmitted diseases, partner selection, use of condoms, avoidance of unintended pregnancy  and contraceptive alternatives.   Advised to avoid cigarette smoking.  I discussed with the patient that most people either abstain from alcohol or drink within safe limits (<=14/week and <=4 drinks/occasion for males, <=7/weeks and <= 3 drinks/occasion for females) and that the risk for alcohol disorders and other health effects rises proportionally with the number of drinks per week and how often a drinker exceeds daily limits.  Discussed cessation/primary prevention of drug use and availability of treatment for abuse.   Diet: Encouraged to adjust caloric intake to maintain  or achieve ideal body weight, to reduce intake of dietary saturated fat and total fat, to limit sodium intake by avoiding high sodium foods and not adding table salt, and to maintain adequate dietary potassium and calcium preferably from fresh fruits, vegetables, and low-fat dairy products.    stressed the importance of regular  exercise  Injury prevention: Discussed safety belts, safety helmets, smoke detector, smoking near bedding or upholstery.   Dental health: Discussed importance of regular tooth brushing, flossing, and  dental visits.   Follow up plan: NEXT PREVENTATIVE PHYSICAL DUE IN 1 YEAR. Return in about 6 months (around 07/14/2021), or with PCP.

## 2021-01-14 LAB — LIPID PANEL W/O CHOL/HDL RATIO
Cholesterol, Total: 170 mg/dL (ref 100–199)
HDL: 31 mg/dL — ABNORMAL LOW (ref >39)
LDL Chol Calc (NIH): 126 mg/dL — ABNORMAL HIGH (ref 0–99)
Triglycerides: 68 mg/dL (ref 0–149)
VLDL Cholesterol Cal: 13 mg/dL (ref 5–40)

## 2021-01-14 LAB — CBC WITH DIFFERENTIAL/PLATELET
Basophils Absolute: 0 10*3/uL (ref 0.0–0.2)
Basos: 1 %
EOS (ABSOLUTE): 0.1 10*3/uL (ref 0.0–0.4)
Eos: 2 %
Hematocrit: 43.1 % (ref 37.5–51.0)
Hemoglobin: 15.1 g/dL (ref 13.0–17.7)
Immature Grans (Abs): 0 10*3/uL (ref 0.0–0.1)
Immature Granulocytes: 0 %
Lymphocytes Absolute: 1.1 10*3/uL (ref 0.7–3.1)
Lymphs: 25 %
MCH: 32.5 pg (ref 26.6–33.0)
MCHC: 35 g/dL (ref 31.5–35.7)
MCV: 93 fL (ref 79–97)
Monocytes Absolute: 0.3 10*3/uL (ref 0.1–0.9)
Monocytes: 6 %
Neutrophils Absolute: 2.9 10*3/uL (ref 1.4–7.0)
Neutrophils: 66 %
Platelets: 125 10*3/uL — ABNORMAL LOW (ref 150–450)
RBC: 4.65 x10E6/uL (ref 4.14–5.80)
RDW: 12.2 % (ref 11.6–15.4)
WBC: 4.4 10*3/uL (ref 3.4–10.8)

## 2021-01-14 LAB — COMPREHENSIVE METABOLIC PANEL
ALT: 22 IU/L (ref 0–44)
AST: 20 IU/L (ref 0–40)
Albumin/Globulin Ratio: 1.6 (ref 1.2–2.2)
Albumin: 4.6 g/dL (ref 3.8–4.8)
Alkaline Phosphatase: 37 IU/L — ABNORMAL LOW (ref 44–121)
BUN/Creatinine Ratio: 18 (ref 10–24)
BUN: 19 mg/dL (ref 8–27)
Bilirubin Total: 0.5 mg/dL (ref 0.0–1.2)
CO2: 20 mmol/L (ref 20–29)
Calcium: 9.5 mg/dL (ref 8.6–10.2)
Chloride: 100 mmol/L (ref 96–106)
Creatinine, Ser: 1.08 mg/dL (ref 0.76–1.27)
Globulin, Total: 2.9 g/dL (ref 1.5–4.5)
Glucose: 120 mg/dL — ABNORMAL HIGH (ref 65–99)
Potassium: 4.2 mmol/L (ref 3.5–5.2)
Sodium: 137 mmol/L (ref 134–144)
Total Protein: 7.5 g/dL (ref 6.0–8.5)
eGFR: 75 mL/min/{1.73_m2} (ref >59)

## 2021-01-14 LAB — TESTOSTERONE, FREE, TOTAL, SHBG
Sex Hormone Binding: 53.2 nmol/L (ref 19.3–76.4)
Testosterone, Free: 22.9 pg/mL — ABNORMAL HIGH (ref 6.6–18.1)
Testosterone: >1500 ng/dL — ABNORMAL HIGH (ref 264–916)

## 2021-01-14 LAB — PSA: Prostate Specific Ag, Serum: 0.6 ng/mL (ref 0.0–4.0)

## 2021-01-14 LAB — TSH: TSH: 1.78 u[IU]/mL (ref 0.450–4.500)

## 2021-01-14 NOTE — Assessment & Plan Note (Signed)
Under good control on current regimen. Continue current regimen. Continue to monitor. Call with any concerns. Labs drawn today.  

## 2021-01-14 NOTE — Assessment & Plan Note (Signed)
Managed by urology/functional medicine. Labs drawn today. Will send them a copy of the labs. Await results.

## 2021-01-14 NOTE — Assessment & Plan Note (Signed)
Under good control with A1c of 5.6- will continue diet and exercise. Call with any concerns. Continue to monitor.

## 2021-02-10 ENCOUNTER — Telehealth: Payer: Self-pay

## 2021-02-10 NOTE — Telephone Encounter (Signed)
Please advise Copied from [facility] [phone]. Topic: General - Other >> Feb 10, 2021  9:35 AM Leward Quan A wrote: Reason for CRM: Patient called in to inform dr Neomia Dear that he tested positive for Covid and is requesting Doxycycline for 7 days and would like to be set up for the Monoclonal infusion,  can be reached at Ph# [phone]

## 2021-02-10 NOTE — Progress Notes (Signed)
There were no vitals taken for this visit.   Subjective:    Patient ID: Luke Cain, male    DOB: 1951-09-04, 70 y.o.   MRN: [id number]  HPI: Luke Cain is a 70 y.o. male  Chief Complaint  Patient presents with  . Covid Positive    Tested positive yesterday morning  Started taking the following yesterday morning.  Aspirin 56m zinc HDQ 564mBID D 3 20000U daily NAC 60079maily  Vitamin C 1000m69mermectin 0.6mg 27mly azithromycin 500mg 37my   UPPER RESPIRATORY TRACT INFECTION Worst symptom: COVID positive for COVID yesterday.  Symptoms started Tuesday.  Fever: yes 101 Cough: yes Shortness of breath: yes  Wheezing: no Chest pain: no Chest tightness: no Chest congestion: yes Nasal congestion: yes Runny nose: yes Post nasal drip: no Sneezing: no Sore throat: no Swollen glands: no Sinus pressure: yes Headache: yes Face pain: no Toothache: no Ear pain: no bilateral Ear pressure: yes bilateral Eyes red/itching:no Eye drainage/crusting: no  Vomiting: no Rash: no Fatigue: yes Sick contacts: yes Strep contacts: no  Context: stable Recurrent sinusitis: no Relief with OTC cold/cough medications: no  Treatments attempted: Aspirin, Vit D, Zinc, Ivermectin and Azithromycin   Relevant past medical, surgical, family and social history reviewed and updated as indicated. Interim medical history since our last visit reviewed. Allergies and medications reviewed and updated.  Review of Systems  Constitutional: Positive for fatigue and fever.  HENT: Positive for congestion, rhinorrhea and sinus pressure. Negative for ear pain, postnasal drip, sinus pain, sneezing and sore throat.   Respiratory: Positive for cough and shortness of breath. Negative for chest tightness and wheezing.   Gastrointestinal: Negative for vomiting.  Skin: Negative for rash.  Neurological: Negative for headaches.    Per HPI unless specifically indicated above     Objective:    There were  no vitals taken for this visit.  Wt Readings from Last 3 Encounters:  01/12/21 216 lb 3.2 oz (98.1 kg)  09/16/20 223 lb 3.2 oz (101.2 kg)  09/01/20 210 lb (95.3 kg)    Physical Exam Vitals and nursing note reviewed.  Constitutional:      General: He is not in acute distress.    Appearance: He is not ill-appearing.  HENT:     Head: Normocephalic.     Right Ear: Hearing normal.     Left Ear: Hearing normal.     Nose: Nose normal.  Pulmonary:     Effort: Pulmonary effort is normal. No respiratory distress.  Neurological:     Mental Status: He is alert.  Psychiatric:        Mood and Affect: Mood normal.        Behavior: Behavior normal.        Thought Content: Thought content normal.        Judgment: Judgment normal.     Results for orders placed or performed in visit on 01/12/21  Comprehensive metabolic panel  Result Value Ref Range   Glucose 120 (H) 65 - 99 mg/dL   BUN 19 8 - 27 mg/dL   Creatinine, Ser 1.08 0.76 - 1.27 mg/dL   eGFR 75 >59 mL/min/1.73   BUN/Creatinine Ratio 18 10 - 24   Sodium 137 134 - 144 mmol/L   Potassium 4.2 3.5 - 5.2 mmol/L   Chloride 100 96 - 106 mmol/L   CO2 20 20 - 29 mmol/L   Calcium 9.5 8.6 - 10.2 mg/dL   Total Protein 7.5 6.0 - 8.5 g/dL  Albumin 4.6 3.8 - 4.8 g/dL   Globulin, Total 2.9 1.5 - 4.5 g/dL   Albumin/Globulin Ratio 1.6 1.2 - 2.2   Bilirubin Total 0.5 0.0 - 1.2 mg/dL   Alkaline Phosphatase 37 (L) 44 - 121 IU/L   AST 20 0 - 40 IU/L   ALT 22 0 - 44 IU/L  CBC with Differential/Platelet  Result Value Ref Range   WBC 4.4 3.4 - 10.8 x10E3/uL   RBC 4.65 4.14 - 5.80 x10E6/uL   Hemoglobin 15.1 13.0 - 17.7 g/dL   Hematocrit 43.1 37.5 - 51.0 %   MCV 93 79 - 97 fL   MCH 32.5 26.6 - 33.0 pg   MCHC 35.0 31.5 - 35.7 g/dL   RDW 12.2 11.6 - 15.4 %   Platelets 125 (L) 150 - 450 x10E3/uL   Neutrophils 66 Not Estab. %   Lymphs 25 Not Estab. %   Monocytes 6 Not Estab. %   Eos 2 Not Estab. %   Basos 1 Not Estab. %   Neutrophils Absolute  2.9 1.4 - 7.0 x10E3/uL   Lymphocytes Absolute 1.1 0.7 - 3.1 x10E3/uL   Monocytes Absolute 0.3 0.1 - 0.9 x10E3/uL   EOS (ABSOLUTE) 0.1 0.0 - 0.4 x10E3/uL   Basophils Absolute 0.0 0.0 - 0.2 x10E3/uL   Immature Granulocytes 0 Not Estab. %   Immature Grans (Abs) 0.0 0.0 - 0.1 x10E3/uL  Lipid Panel w/o Chol/HDL Ratio  Result Value Ref Range   Cholesterol, Total 170 100 - 199 mg/dL   Triglycerides 68 0 - 149 mg/dL   HDL 31 (L) >39 mg/dL   VLDL Cholesterol Cal 13 5 - 40 mg/dL   LDL Chol Calc (NIH) 126 (H) 0 - 99 mg/dL  PSA  Result Value Ref Range   Prostate Specific Ag, Serum 0.6 0.0 - 4.0 ng/mL  TSH  Result Value Ref Range   TSH 1.780 0.450 - 4.500 uIU/mL  Urinalysis, Routine w reflex microscopic  Result Value Ref Range   Specific Gravity, UA <1.005 (L) 1.005 - 1.030   pH, UA 6.5 5.0 - 7.5   Color, UA Yellow Yellow   Appearance Ur Clear Clear   Leukocytes,UA Negative Negative   Protein,UA Negative Negative/Trace   Glucose, UA Negative Negative   Ketones, UA Negative Negative   RBC, UA Negative Negative   Bilirubin, UA Negative Negative   Urobilinogen, Ur 0.2 0.2 - 1.0 mg/dL   Nitrite, UA Negative Negative  Microalbumin, Urine Waived  Result Value Ref Range   Microalb, Ur Waived 10 0 - 19 mg/L   Creatinine, Urine Waived 50 10 - 300 mg/dL   Microalb/Creat Ratio <30 <30 mg/g  Bayer DCA Hb A1c Waived  Result Value Ref Range   HB A1C (BAYER DCA - WAIVED) 5.6 <7.0 %  Testosterone, free, total([facility])  Result Value Ref Range   Testosterone >1500 (H) 264 - 916 ng/dL   Testosterone, Free 22.9 (H) 6.6 - 18.1 pg/mL   Sex Hormone Binding 53.2 19.3 - 76.4 nmol/L      Assessment & Plan:   Problem List Items Addressed This Visit      Other   COVID-19 - Primary    Advised patient on symptom management. Discussed with patient that it is not recommended to take Ivermetcin or Azithromycin due to COVID 19 being a viral infection.  Discussed that Doxycyline is not indicated.   Patient requested monoclonal antibodies. Referral placed for infusion center. Discussed with patient that he is a candidate for antiviral medications.  Patient would like to try antibodies first. Referral has been placed.       Relevant Orders   Ambulatory referral for Covid Treatment       Follow up plan: No follow-ups on file.    This visit was completed via MyChart due to the restrictions of the COVID-19 pandemic. All issues as above were discussed and addressed. Physical exam was done as above through visual confirmation on MyChart. If it was felt that the patient should be evaluated in the office, they were directed there. The patient verbally consented to this visit. 1. Location of the patient: Home 2. Location of the provider: Office 3. Those involved with this call:  ? Provider: Jon Billings, NP ? CMA: Tiffany Reel, CMA ? Front Desk/Registration: Levert Feinstein 4. Time spent on call: 15 minutes with patient face to face via video conference. More than 50% of this time was spent in counseling and coordination of care. 21 minutes total spent in review of patient's record and preparation of their chart.

## 2021-02-10 NOTE — Telephone Encounter (Signed)
Pt's pcp did not have availability pt wanted to be seen this week scheduled for 02/11/2021 at 9:00 with [facility].Luke Cain

## 2021-02-11 ENCOUNTER — Telehealth (INDEPENDENT_AMBULATORY_CARE_PROVIDER_SITE_OTHER): Payer: Medicare Other | Admitting: Nurse Practitioner

## 2021-02-11 ENCOUNTER — Encounter: Payer: Self-pay | Admitting: Nurse Practitioner

## 2021-02-11 DIAGNOSIS — U071 COVID-19: Secondary | ICD-10-CM | POA: Insufficient documentation

## 2021-02-11 NOTE — Assessment & Plan Note (Addendum)
Advised patient on symptom management. Discussed with patient that it is not recommended to take Ivermetcin or Azithromycin due to COVID 19 being a viral infection.  Discussed that Doxycyline is not indicated.  Patient requested monoclonal antibodies. Referral placed for infusion center. Discussed with patient that he is a candidate for antiviral medications. Patient would like to try antibodies first. Referral has been placed.

## 2021-02-12 ENCOUNTER — Other Ambulatory Visit: Payer: Self-pay | Admitting: Adult Health

## 2021-02-12 DIAGNOSIS — J1282 Pneumonia due to coronavirus disease 2019: Secondary | ICD-10-CM

## 2021-02-12 DIAGNOSIS — U071 COVID-19: Secondary | ICD-10-CM

## 2021-02-12 NOTE — Progress Notes (Signed)
I connected by phone with Luke Cain on 02/12/2021 at 3:30 PM to discuss the potential use of a new treatment for mild to moderate COVID-19 viral infection in non-hospitalized patients.  This patient is a 70 y.o. male that meets the FDA criteria for Emergency Use Authorization of COVID monoclonal antibody bebtelovimab.  Has a (+) direct SARS-CoV-2 viral test result  Has mild or moderate COVID-19   Is NOT hospitalized due to COVID-19  Is within 10 days of symptom onset  Has at least one of the high risk factor(s) for progression to severe COVID-19 and/or hospitalization as defined in EUA.  Specific high risk criteria : Older age (>/= 69 yo) and Cardiovascular disease or hypertension   Sx onset 5/17   I have spoken and communicated the following to the patient or parent/caregiver regarding COVID monoclonal antibody treatment:  1. FDA has authorized the emergency use for the treatment of mild to moderate COVID-19 in adults and pediatric patients with positive results of direct SARS-CoV-2 viral testing who are 31 years of age and older weighing at least 40 kg, and who are at high risk for progressing to severe COVID-19 and/or hospitalization.  2. The significant known and potential risks and benefits of COVID monoclonal antibody, and the extent to which such potential risks and benefits are unknown.  3. Information on available alternative treatments and the risks and benefits of those alternatives, including clinical trials.  4. Patients treated with COVID monoclonal antibody should continue to self-isolate and use infection control measures (e.g., wear mask, isolate, social distance, avoid sharing personal items, clean and disinfect "high touch" surfaces, and frequent handwashing) according to CDC guidelines.   5. The patient or parent/caregiver has the option to accept or refuse COVID monoclonal antibody treatment.  6. Discussion about the monoclonal antibody infusion does not ensure  treatment. The patient will be placed on a list and scheduled according to risk, symptom onset and availability. A scheduler will reach to the patient to let them know if we can accommodate their infusion or not.  After reviewing this information with the patient, the patient has agreed to receive one of the available covid 19 monoclonal antibodies and will be provided an appropriate fact sheet prior to infusion. Scot Dock, NP 02/12/2021 3:30 PM

## 2021-05-12 DIAGNOSIS — L821 Other seborrheic keratosis: Secondary | ICD-10-CM | POA: Diagnosis not present

## 2021-05-12 DIAGNOSIS — D2372 Other benign neoplasm of skin of left lower limb, including hip: Secondary | ICD-10-CM | POA: Diagnosis not present

## 2021-05-12 DIAGNOSIS — L298 Other pruritus: Secondary | ICD-10-CM | POA: Diagnosis not present

## 2021-05-12 DIAGNOSIS — Z08 Encounter for follow-up examination after completed treatment for malignant neoplasm: Secondary | ICD-10-CM | POA: Diagnosis not present

## 2021-05-12 DIAGNOSIS — L820 Inflamed seborrheic keratosis: Secondary | ICD-10-CM | POA: Diagnosis not present

## 2021-05-12 DIAGNOSIS — L570 Actinic keratosis: Secondary | ICD-10-CM | POA: Diagnosis not present

## 2021-05-12 DIAGNOSIS — Z85828 Personal history of other malignant neoplasm of skin: Secondary | ICD-10-CM | POA: Diagnosis not present

## 2021-05-12 DIAGNOSIS — X32XXXA Exposure to sunlight, initial encounter: Secondary | ICD-10-CM | POA: Diagnosis not present

## 2021-05-12 DIAGNOSIS — L538 Other specified erythematous conditions: Secondary | ICD-10-CM | POA: Diagnosis not present

## 2021-06-04 ENCOUNTER — Ambulatory Visit (INDEPENDENT_AMBULATORY_CARE_PROVIDER_SITE_OTHER): Payer: Medicare Other

## 2021-06-04 DIAGNOSIS — Z0000 Encounter for general adult medical examination without abnormal findings: Secondary | ICD-10-CM | POA: Diagnosis not present

## 2021-06-04 NOTE — Progress Notes (Signed)
Subjective:   MARKEISE MONTVILLE is a 70 y.o. male who presents for Medicare Annual/Subsequent preventive examination.I connected with  Katherene Ponto on 06/04/21 by a audio enabled telemedicine application and verified that I am speaking with the correct person using two identifiers.   I discussed the limitations of evaluation and management by telemedicine. The patient expressed understanding and agreed to proceed.   Location of patient:Home  Location of Provider: Office  Persons participating in virtual visit: [place] (patient) and Valli Glance Review of Systems    Defer to PCP       Objective:    There were no vitals filed for this visit. There is no height or weight on file to calculate BMI.  Advanced Directives 06/04/2021 08/23/2020 04/02/2020 01/15/2017  Does Patient Have a Medical Advance Directive? No No Yes No  Type of Advance Directive - Public librarian;Living will -  Copy of [place] in Chart? - - No - copy requested -  Would patient like information on creating a medical advance directive? - - - Yes (MAU/Ambulatory/Procedural Areas - Information given)    Current Medications (verified) Outpatient Encounter Medications as of 06/04/2021  Medication Sig   Multiple Vitamin (MULTI-VITAMINS) TABS Take 1 tablet by mouth daily.    Multiple Vitamins-Minerals (ZINC PO) Take 1 tablet by mouth daily.    Tiotropium Bromide-Olodaterol (STIOLTO RESPIMAT) 2.5-2.5 MCG/ACT AERS Inhale 2 puffs into the lungs daily.   Vitamin D3 (VITAMIN D) 25 MCG tablet Take 1,000 Units by mouth daily.    No facility-administered encounter medications on file as of 06/04/2021.    Allergies (verified) Patient has no known allergies.   History: Past Medical History:  Diagnosis Date   Arthritis    hands, knees   Cancer ([place])    skin cancer   Pre-diabetes    lost weight and as of 07-14-20 A1C is 5.0   Sleep apnea    Uses Cpap   Past Surgical History:   Procedure Laterality Date   ANAL FISSURE REPAIR     over 30 yrs ago   CARPAL TUNNEL RELEASE  09/15/2020   COLONOSCOPY WITH PROPOFOL N/A 01/15/2017   Procedure: COLONOSCOPY WITH PROPOFOL;  Surgeon: Lucilla Lame, MD;  Location: [place];  Service: Endoscopy;  Laterality: N/A;  sleep apnea   POLYPECTOMY  01/15/2017   Procedure: POLYPECTOMY;  Surgeon: Lucilla Lame, MD;  Location: [place];  Service: Endoscopy;;   UMBILICAL HERNIA REPAIR N/A 09/01/2020   Procedure: HERNIA REPAIR UMBILICAL ADULT;  Surgeon: Ronny Bacon, MD;  Location: [facility];  Service: General;  Laterality: N/A;   Family History  Problem Relation Age of Onset   Thyroid disease Mother    Varicose Veins Mother    Congestive Heart Failure Mother    Diabetes Father    Heart disease Father    Heart attack Father    Social History   Socioeconomic History   Marital status: Married    Spouse name: Not on file   Number of children: Not on file   Years of education: Not on file   Highest education level: Not on file  Occupational History   Occupation: semi retired  Tobacco Use   Smoking status: Former    Packs/day: 1.00    Years: 30.00    Pack years: 30.00    Types: Cigarettes    Quit date: 04/14/1999    Years since quitting: 22.1   Smokeless tobacco: Former    Quit  date: 04/14/1999  Vaping Use   Vaping Use: Never used  Substance and Sexual Activity   Alcohol use: Yes    Alcohol/week: 5.0 - 6.0 standard drinks    Types: 5 - 6 Standard drinks or equivalent per week   Drug use: No   Sexual activity: Not on file  Other Topics Concern   Not on file  Social History Narrative   Not on file   Social Determinants of Health   Financial Resource Strain: Low Risk    Difficulty of Paying Living Expenses: Not hard at all  Food Insecurity: No Food Insecurity   Worried About Charity fundraiser in the Last Year: Never true   [place] in the Last Year: Never true  Transportation Needs: No  Transportation Needs   Lack of Transportation (Medical): No   Lack of Transportation (Non-Medical): No  Physical Activity: Inactive   Days of Exercise per Week: 0 days   Minutes of Exercise per Session: 0 min  Stress: No Stress Concern Present   Feeling of Stress : Not at all  Social Connections: Moderately Isolated   Frequency of Communication with Friends and Family: Three times a week   Frequency of Social Gatherings with Friends and Family: Three times a week   Attends Religious Services: Never   Active Member of Clubs or Organizations: No   Attends Music therapist: Never   Marital Status: Married    Tobacco Counseling Counseling given: Not Answered   Clinical Intake:  Pre-visit preparation completed: Yes  Pain : No/denies pain        How often do you need to have someone help you when you read instructions, pamphlets, or other written materials from your doctor or pharmacy?: 1 - Never What is the last grade level you completed in school?: 12th grade  Diabetic?No  Interpreter Needed?: No  Information entered by :: Alie Moudy J,CMA   Activities of Daily Living In your present state of health, do you have any difficulty performing the following activities: 06/04/2021 01/12/2021  Hearing? N Y  Vision? N N  Difficulty concentrating or making decisions? N N  Walking or climbing stairs? N N  Dressing or bathing? N N  Doing errands, shopping? N N  Preparing Food and eating ? N -  Using the Toilet? N -  In the past six months, have you accidently leaked urine? N -  Do you have problems with loss of bowel control? N -  Managing your Medications? N -  Managing your Finances? N -  Housekeeping or managing your Housekeeping? N -  Some recent data might be hidden    Patient Care Team: Charlynne Cousins, MD as PCP - General Ericka Pontiff, MD as Referring Physician (Obstetrics and Gynecology) Royston Cowper, MD as Consulting Physician (Urology)  Indicate  any recent Medical Services you may have received from other than [facility] providers in the past year (date may be approximate).     Assessment:   This is a routine wellness examination for Saquan.  Hearing/Vision screen No results found.  Dietary issues and exercise activities discussed: Current Exercise Habits: The patient does not participate in regular exercise at present   Goals Addressed   None    Depression Screen PHQ 2/9 Scores 06/04/2021 01/12/2021 07/14/2020 04/02/2020 03/25/2020 03/12/2019 02/25/2018  PHQ - 2 Score 0 0 0 0 0 0 0  PHQ- 9 Score - - 0 - 2 2 -    Fall Risk Fall  Risk  06/04/2021 01/12/2021 07/14/2020 04/02/2020 03/25/2020  Falls in the past year? 0 0 0 0 0  Number falls in past yr: 0 0 - - 0  Injury with Fall? 0 0 - - 0  Risk for fall due to : No Fall Risks No Fall Risks - No Fall Risks -  Follow up Falls evaluation completed Falls evaluation completed Falls evaluation completed Falls evaluation completed;Education provided;Falls prevention discussed -    FALL RISK PREVENTION PERTAINING TO THE HOME:  Any stairs in or around the home? Yes  If so, are there any without handrails? No  Home free of loose throw rugs in walkways, pet beds, electrical cords, etc? No  Adequate lighting in your home to reduce risk of falls? No   ASSISTIVE DEVICES UTILIZED TO PREVENT FALLS:  Life alert? No  Use of a cane, walker or w/c? No  Grab bars in the bathroom? No  Shower chair or bench in shower? Yes  Elevated toilet seat or a handicapped toilet? Yes   TIMED UP AND GO:  Was the test performed?  N/A .  Length of time to ambulate 10 feet: N/A sec.     Cognitive Function:     6CIT Screen 06/04/2021 04/02/2020  What Year? 0 points 0 points  What month? 0 points 0 points  What time? 0 points 0 points  Count back from 20 0 points 0 points  Months in reverse 0 points 4 points  Repeat phrase 0 points 0 points  Total Score 0 4    Immunizations Immunization History  Administered  Date(s) Administered   Influenza,inj,Quad PF,6+ Mos 10/24/2016   Influenza-Unspecified 07/05/2020   PFIZER Comirnaty(Gray Top)Covid-19 Tri-Sucrose Vaccine 11/12/2019, 12/04/2019   PFIZER(Purple Top)SARS-COV-2 Vaccination 07/05/2020   Pneumococcal Conjugate-13 03/12/2019   Pneumococcal Polysaccharide-23 07/14/2020   Tdap 05/02/2012   Zoster Recombinat (Shingrix) 01/22/2017    TDAP status: Up to date  Flu Vaccine status: Due, Education has been provided regarding the importance of this vaccine. Advised may receive this vaccine at local pharmacy or Health Dept. Aware to provide a copy of the vaccination record if obtained from local pharmacy or Health Dept. Verbalized acceptance and understanding.  Pneumococcal vaccine status: Up to date  Covid-19 vaccine status: Information provided on how to obtain vaccines.   Qualifies for Shingles Vaccine? Yes   Zostavax completed No   Shingrix Completed?: No.    Education has been provided regarding the importance of this vaccine. Patient has been advised to call insurance company to determine out of pocket expense if they have not yet received this vaccine. Advised may also receive vaccine at local pharmacy or Health Dept. Verbalized acceptance and understanding.  Screening Tests Health Maintenance  Topic Date Due   OPHTHALMOLOGY EXAM  Never done   Zoster Vaccines- Shingrix (2 of 2) 03/19/2017   COVID-19 Vaccine (4 - Booster for Pfizer series) 11/05/2020   INFLUENZA VACCINE  04/25/2021   HEMOGLOBIN A1C  07/14/2021   FOOT EXAM  01/12/2022   URINE MICROALBUMIN  01/12/2022   COLONOSCOPY (Pts 45-77yr Insurance coverage will need to be confirmed)  01/15/2022   TETANUS/TDAP  05/02/2022   Hepatitis C Screening  Completed   PNA vac Low Risk Adult  Completed   HPV VACCINES  Aged Out    Health Maintenance  Health Maintenance Due  Topic Date Due   OPHTHALMOLOGY EXAM  Never done   Zoster Vaccines- Shingrix (2 of 2) 03/19/2017   COVID-19 Vaccine  (4 - Booster for PCoca-Cola  series) 11/05/2020   INFLUENZA VACCINE  04/25/2021    Colorectal cancer screening: Type of screening: Colonoscopy. Completed 01/15/2017. Repeat every 5 years  Lung Cancer Screening: (Low Dose CT Chest recommended if Age 12-80 years, 30 pack-year currently smoking OR have quit w/in 15years.) does not qualify.   Lung Cancer Screening Referral: no  Additional Screening:  Hepatitis C Screening: does qualify; Completed 03/17/2018  Vision Screening: Recommended annual ophthalmology exams for early detection of glaucoma and other disorders of the eye. Is the patient up to date with their annual eye exam?  Yes  Who is the provider or what is the name of the office in which the patient attends annual eye exams? [facility] If pt is not established with a provider, would they like to be referred to a provider to establish care? No .   Dental Screening: Recommended annual dental exams for proper oral hygiene  Community Resource Referral / Chronic Care Management: CRR required this visit?  No   CCM required this visit?  No      Plan:     I have personally reviewed and noted the following in the patient's chart:   Medical and social history Use of alcohol, tobacco or illicit drugs  Current medications and supplements including opioid prescriptions. Patient is not currently taking opioid prescriptions. Functional ability and status Nutritional status Physical activity Advanced directives List of other physicians Hospitalizations, surgeries, and ER visits in previous 12 months Vitals Screenings to include cognitive, depression, and falls Referrals and appointments  In addition, I have reviewed and discussed with patient certain preventive protocols, quality metrics, and best practice recommendations. A written personalized care plan for preventive services as well as general preventive health recommendations were provided to patient.     Edgar Frisk,  [place]   06/04/2021   Nurse Notes: Non Face to Face 30 minute visit   Mr. Chenault , Thank you for taking time to come for your Medicare Wellness Visit. I appreciate your ongoing commitment to your health goals. Please review the following plan we discussed and let me know if I can assist you in the future.   These are the goals we discussed:  Goals      Patient Stated     04/02/2020, wants to exercise more, lose weight and lower A1C        This is a list of the screening recommended for you and due dates:  Health Maintenance  Topic Date Due   Eye exam for diabetics  Never done   Zoster (Shingles) Vaccine (2 of 2) 03/19/2017   COVID-19 Vaccine (4 - Booster for Pfizer series) 11/05/2020   Flu Shot  04/25/2021   Hemoglobin A1C  07/14/2021   Complete foot exam   01/12/2022   Urine Protein Check  01/12/2022   Colon Cancer Screening  01/15/2022   Tetanus Vaccine  05/02/2022   Hepatitis C Screening: USPSTF Recommendation to screen - Ages 18-79 yo.  Completed   Pneumonia vaccines  Completed   HPV Vaccine  Aged Out

## 2021-06-04 NOTE — Patient Instructions (Signed)
Health Maintenance, Male Adopting a healthy lifestyle and getting preventive care are important in promoting health and wellness. Ask your health care provider about: The right schedule for you to have regular tests and exams. Things you can do on your own to prevent diseases and keep yourself healthy. What should I know about diet, weight, and exercise? Eat a healthy diet  Eat a diet that includes plenty of vegetables, fruits, low-fat dairy products, and lean protein. Do not eat a lot of foods that are high in solid fats, added sugars, or sodium. Maintain a healthy weight Body mass index (BMI) is a measurement that can be used to identify possible weight problems. It estimates body fat based on height and weight. Your health care provider can help determine your BMI and help you achieve or maintain a healthy weight. Get regular exercise Get regular exercise. This is one of the most important things you can do for your health. Most adults should: Exercise for at least 150 minutes each week. The exercise should increase your heart rate and make you sweat (moderate-intensity exercise). Do strengthening exercises at least twice a week. This is in addition to the moderate-intensity exercise. Spend less time sitting. Even light physical activity can be beneficial. Watch cholesterol and blood lipids Have your blood tested for lipids and cholesterol at 69 years of age, then have this test every 5 years. You may need to have your cholesterol levels checked more often if: Your lipid or cholesterol levels are high. You are older than 69 years of age. You are at high risk for heart disease. What should I know about cancer screening? Many types of cancers can be detected early and may often be prevented. Depending on your health history and family history, you may need to have cancer screening at various ages. This may include screening for: Colorectal cancer. Prostate cancer. Skin cancer. Lung  cancer. What should I know about heart disease, diabetes, and high blood pressure? Blood pressure and heart disease High blood pressure causes heart disease and increases the risk of stroke. This is more likely to develop in people who have high blood pressure readings, are of African descent, or are overweight. Talk with your health care provider about your target blood pressure readings. Have your blood pressure checked: Every 3-5 years if you are 18-39 years of age. Every year if you are 40 years old or older. If you are between the ages of 65 and 75 and are a current or former smoker, ask your health care provider if you should have a one-time screening for abdominal aortic aneurysm (AAA). Diabetes Have regular diabetes screenings. This checks your fasting blood sugar level. Have the screening done: Once every three years after age 45 if you are at a normal weight and have a low risk for diabetes. More often and at a younger age if you are overweight or have a high risk for diabetes. What should I know about preventing infection? Hepatitis B If you have a higher risk for hepatitis B, you should be screened for this virus. Talk with your health care provider to find out if you are at risk for hepatitis B infection. Hepatitis C Blood testing is recommended for: Everyone born from 1945 through 1965. Anyone with known risk factors for hepatitis C. Sexually transmitted infections (STIs) You should be screened each year for STIs, including gonorrhea and chlamydia, if: You are sexually active and are younger than 69 years of age. You are older than 69 years   of age and your health care provider tells you that you are at risk for this type of infection. Your sexual activity has changed since you were last screened, and you are at increased risk for chlamydia or gonorrhea. Ask your health care provider if you are at risk. Ask your health care provider about whether you are at high risk for HIV.  Your health care provider may recommend a prescription medicine to help prevent HIV infection. If you choose to take medicine to prevent HIV, you should first get tested for HIV. You should then be tested every 3 months for as long as you are taking the medicine. Follow these instructions at home: Lifestyle Do not use any products that contain nicotine or tobacco, such as cigarettes, e-cigarettes, and chewing tobacco. If you need help quitting, ask your health care provider. Do not use street drugs. Do not share needles. Ask your health care provider for help if you need support or information about quitting drugs. Alcohol use Do not drink alcohol if your health care provider tells you not to drink. If you drink alcohol: Limit how much you have to 0-2 drinks a day. Be aware of how much alcohol is in your drink. In the [place] one drink equals one 12 oz bottle of beer (355 mL), one 5 oz glass of wine (148 mL), or one 1 oz glass of hard liquor (44 mL). General instructions Schedule regular health, dental, and eye exams. Stay current with your vaccines. Tell your health care provider if: You often feel depressed. You have ever been abused or do not feel safe at home. Summary Adopting a healthy lifestyle and getting preventive care are important in promoting health and wellness. Follow your health care provider's instructions about healthy diet, exercising, and getting tested or screened for diseases. Follow your health care provider's instructions on monitoring your cholesterol and blood pressure. This information is not intended to replace advice given to you by your health care provider. Make sure you discuss any questions you have with your health care provider. Document Revised: 11/19/2020 Document Reviewed: 09/04/2018 Elsevier Patient Education  2022 [employer] Inc.  

## 2021-07-13 DIAGNOSIS — H353132 Nonexudative age-related macular degeneration, bilateral, intermediate dry stage: Secondary | ICD-10-CM | POA: Diagnosis not present

## 2021-07-17 IMAGING — CR DG CHEST 2V
2 series · 2 of 2 positions shown · non-contrast
Comparison: None.

CLINICAL DATA: Worsening shortness of breath for several years,
previous tobacco abuse

EXAM:
CHEST - 2 VIEW

[chest pa]
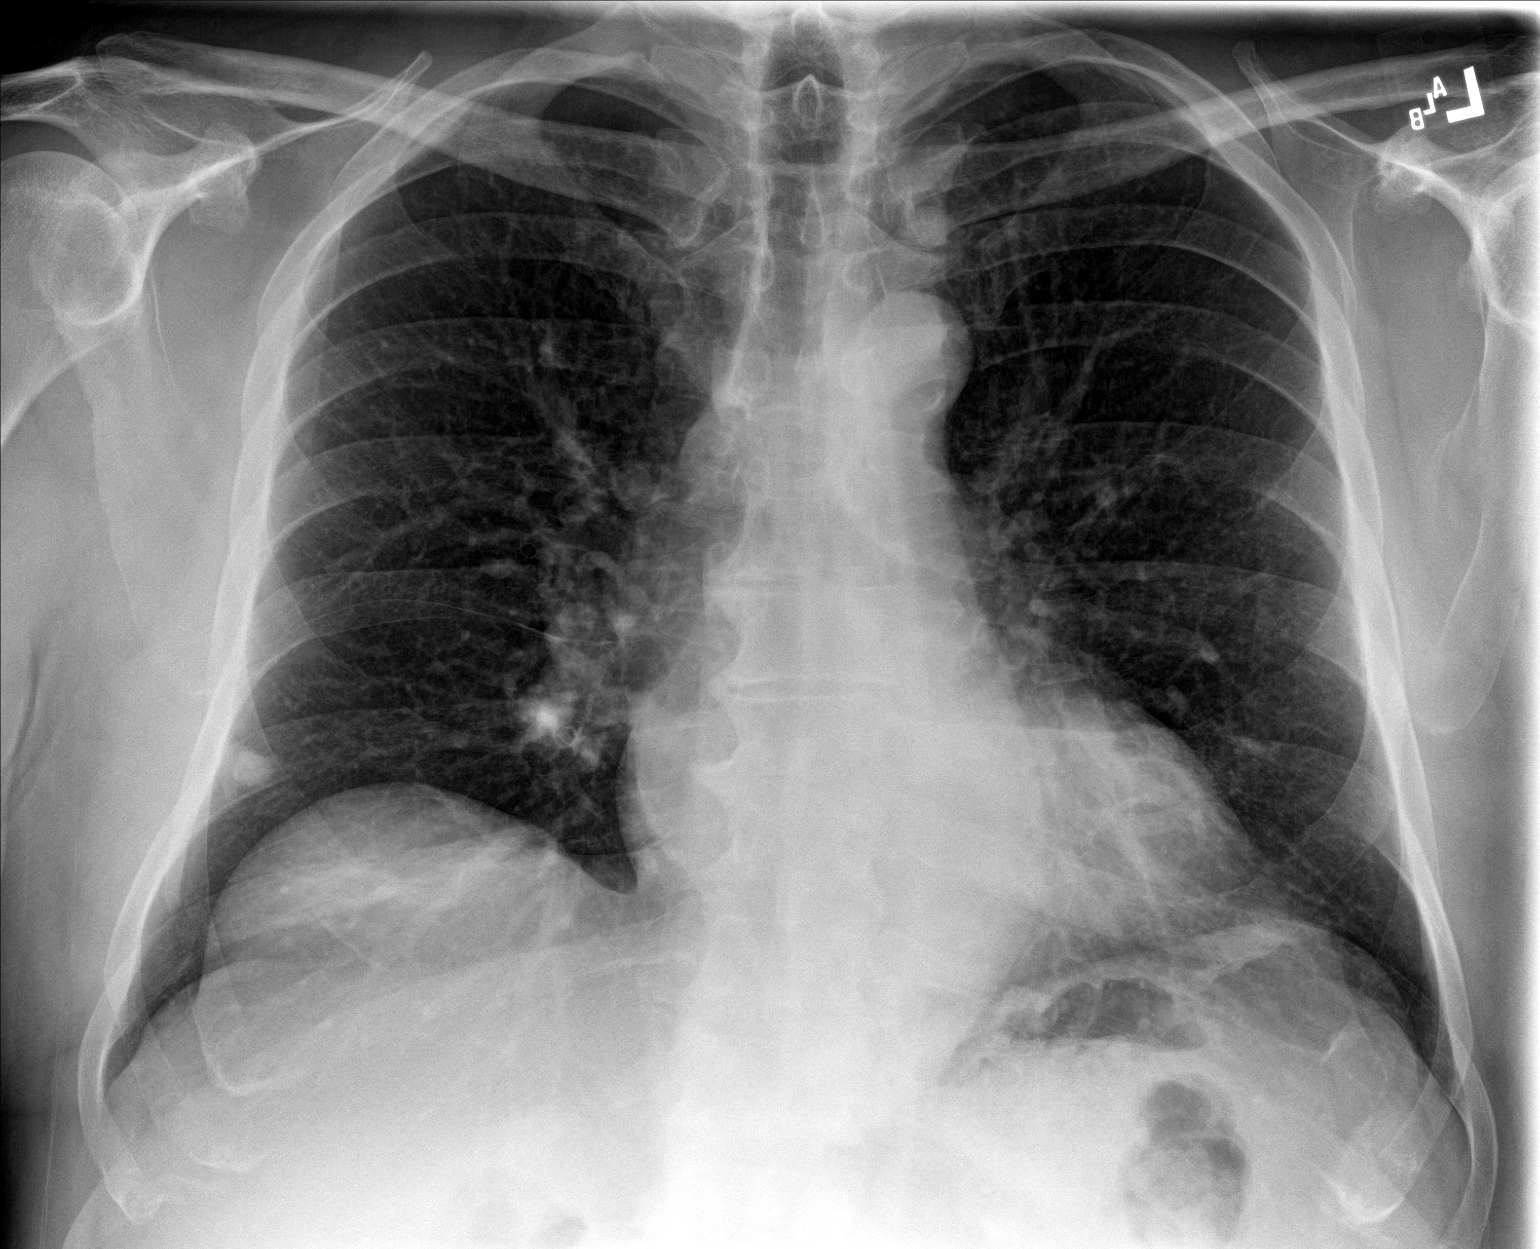

[chest lat]
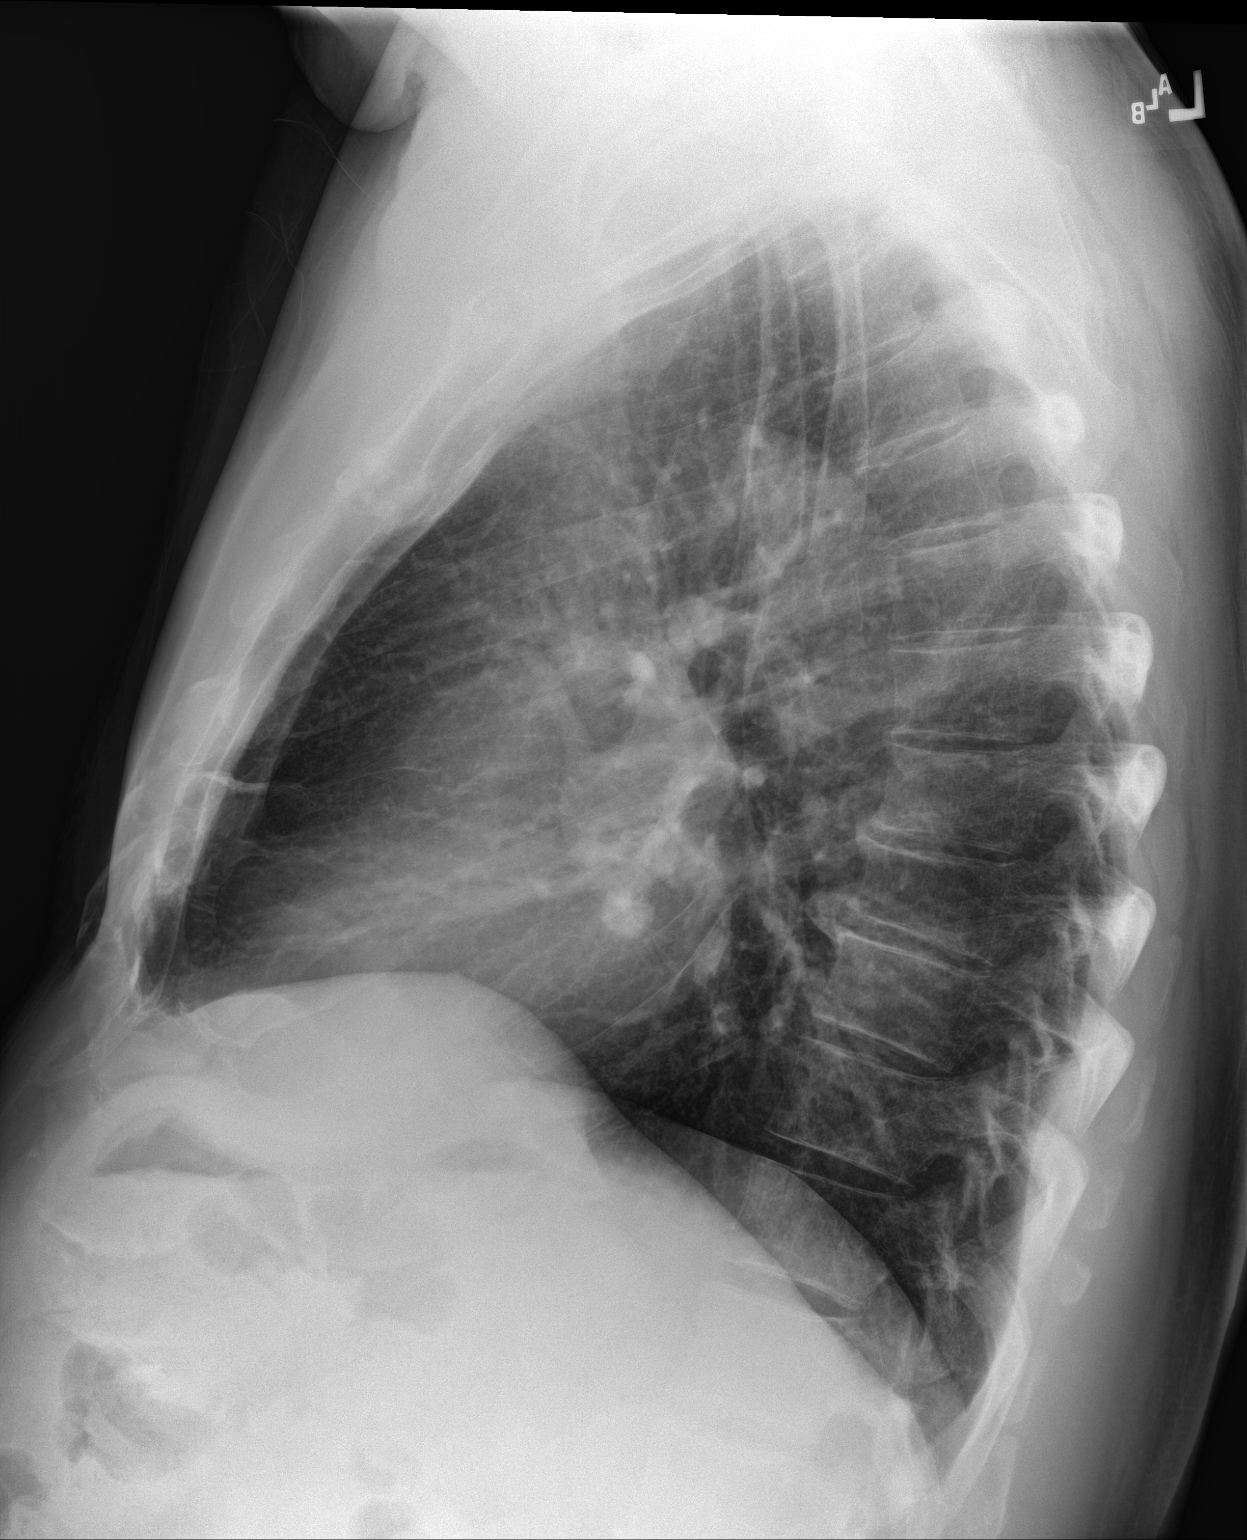

[2 of 2 positions shown; findings below may reference images not displayed]

FINDINGS: Frontal and lateral views of the chest demonstrate an unremarkable
cardiac silhouette. Right middle lobe calcified granuloma and
calcified right hilar lymph nodes consistent with previous
granulomatous disease. Interstitial prominence throughout the lungs
consistent with history of tobacco abuse. No acute airspace disease,
effusion, or pneumothorax. Eventration right hemidiaphragm. No acute
bony abnormalities.
IMPRESSION: 1. Chronic interstitial scarring related to tobacco abuse.
2. No acute intrathoracic process.

## 2021-07-21 ENCOUNTER — Ambulatory Visit: Payer: Medicare Other | Admitting: Internal Medicine

## 2021-08-03 ENCOUNTER — Other Ambulatory Visit: Payer: Self-pay

## 2021-08-03 ENCOUNTER — Encounter: Payer: Self-pay | Admitting: Internal Medicine

## 2021-08-03 ENCOUNTER — Ambulatory Visit (INDEPENDENT_AMBULATORY_CARE_PROVIDER_SITE_OTHER): Payer: Medicare Other | Admitting: Internal Medicine

## 2021-08-03 VITALS — BP 134/78 | HR 73 | Temp 97.9°F | Ht 70.6 in | Wt 233.4 lb

## 2021-08-03 DIAGNOSIS — E119 Type 2 diabetes mellitus without complications: Secondary | ICD-10-CM | POA: Diagnosis not present

## 2021-08-03 DIAGNOSIS — R600 Localized edema: Secondary | ICD-10-CM

## 2021-08-03 DIAGNOSIS — R768 Other specified abnormal immunological findings in serum: Secondary | ICD-10-CM | POA: Diagnosis not present

## 2021-08-03 LAB — URINALYSIS, ROUTINE W REFLEX MICROSCOPIC
Bilirubin, UA: NEGATIVE
Glucose, UA: NEGATIVE
Ketones, UA: NEGATIVE
Leukocytes,UA: NEGATIVE
Nitrite, UA: NEGATIVE
Protein,UA: NEGATIVE
RBC, UA: NEGATIVE
Specific Gravity, UA: 1.015 (ref 1.005–1.030)
Urobilinogen, Ur: 0.2 mg/dL (ref 0.2–1.0)
pH, UA: 6 (ref 5.0–7.5)

## 2021-08-03 LAB — BAYER DCA HB A1C WAIVED: HB A1C (BAYER DCA - WAIVED): 5.7 % — ABNORMAL HIGH (ref 4.8–5.6)

## 2021-08-03 NOTE — Progress Notes (Signed)
BP 134/78   Pulse 73   Temp 97.9 F (36.6 C) (Oral)   Ht 5' 10.6" (1.793 m)   Wt 233 lb 6.4 oz (105.9 kg)   SpO2 95%   BMI 32.92 kg/m    Subjective:    Patient ID: Luke Cain, male    DOB: 03/27/1951, 70 y.o.   MRN: [id number]  Chief Complaint  Patient presents with   Diabetes    HPI: Luke Cain is a 70 y.o. male  Diabetes He presents for his follow-up (high 3-4 yrs ago, off of meds) diabetic visit. He has type 2 diabetes mellitus. Associated symptoms include blurred vision. Pertinent negatives for diabetes include no chest pain, no fatigue, no foot paresthesias, no foot ulcerations, no polydipsia, no polyphagia, no polyuria, no visual change, no weakness and no weight loss. (Has a new eye doctor lives at the [place] - has macular degeneration )   Chief Complaint  Patient presents with   Diabetes    Relevant past medical, surgical, family and social history reviewed and updated as indicated. Interim medical history since our last visit reviewed. Allergies and medications reviewed and updated.  Review of Systems  Constitutional:  Negative for fatigue and weight loss.  Eyes:  Positive for blurred vision.  Cardiovascular:  Negative for chest pain.  Endocrine: Negative for polydipsia, polyphagia and polyuria.  Neurological:  Negative for weakness.   Per HPI unless specifically indicated above     Objective:    BP 134/78   Pulse 73   Temp 97.9 F (36.6 C) (Oral)   Ht 5' 10.6" (1.793 m)   Wt 233 lb 6.4 oz (105.9 kg)   SpO2 95%   BMI 32.92 kg/m   Wt Readings from Last 3 Encounters:  08/03/21 233 lb 6.4 oz (105.9 kg)  01/12/21 216 lb 3.2 oz (98.1 kg)  09/16/20 223 lb 3.2 oz (101.2 kg)    Physical Exam  Results for orders placed or performed in visit on 08/03/21  CBC with Differential/Platelet  Result Value Ref Range   WBC 5.8 3.4 - 10.8 x10E3/uL   RBC 5.17 4.14 - 5.80 x10E6/uL   Hemoglobin 16.8 13.0 - 17.7 g/dL   Hematocrit 48.4 37.5 - 51.0 %   MCV  94 79 - 97 fL   MCH 32.5 26.6 - 33.0 pg   MCHC 34.7 31.5 - 35.7 g/dL   RDW 12.8 11.6 - 15.4 %   Platelets 123 (L) 150 - 450 x10E3/uL   Neutrophils 65 Not Estab. %   Lymphs 22 Not Estab. %   Monocytes 9 Not Estab. %   Eos 2 Not Estab. %   Basos 1 Not Estab. %   Neutrophils Absolute 3.9 1.4 - 7.0 x10E3/uL   Lymphocytes Absolute 1.3 0.7 - 3.1 x10E3/uL   Monocytes Absolute 0.5 0.1 - 0.9 x10E3/uL   EOS (ABSOLUTE) 0.1 0.0 - 0.4 x10E3/uL   Basophils Absolute 0.0 0.0 - 0.2 x10E3/uL   Immature Granulocytes 1 Not Estab. %   Immature Grans (Abs) 0.0 0.0 - 0.1 x10E3/uL  Comprehensive metabolic panel  Result Value Ref Range   Glucose 120 (H) 70 - 99 mg/dL   BUN 16 8 - 27 mg/dL   Creatinine, Ser 1.04 0.76 - 1.27 mg/dL   eGFR 78 >59 mL/min/1.73   BUN/Creatinine Ratio 15 10 - 24   Sodium 136 134 - 144 mmol/L   Potassium 4.1 3.5 - 5.2 mmol/L   Chloride 101 96 - 106 mmol/L   CO2  20 20 - 29 mmol/L   Calcium 9.6 8.6 - 10.2 mg/dL   Total Protein 7.7 6.0 - 8.5 g/dL   Albumin 4.6 3.8 - 4.8 g/dL   Globulin, Total 3.1 1.5 - 4.5 g/dL   Albumin/Globulin Ratio 1.5 1.2 - 2.2   Bilirubin Total 0.8 0.0 - 1.2 mg/dL   Alkaline Phosphatase 38 (L) 44 - 121 IU/L   AST 22 0 - 40 IU/L   ALT 30 0 - 44 IU/L  Urinalysis, Routine w reflex microscopic  Result Value Ref Range   Specific Gravity, UA 1.015 1.005 - 1.030   pH, UA 6.0 5.0 - 7.5   Color, UA Yellow Yellow   Appearance Ur Clear Clear   Leukocytes,UA Negative Negative   Protein,UA Negative Negative/Trace   Glucose, UA Negative Negative   Ketones, UA Negative Negative   RBC, UA Negative Negative   Bilirubin, UA Negative Negative   Urobilinogen, Ur 0.2 0.2 - 1.0 mg/dL   Nitrite, UA Negative Negative  Hepatitis B core antibody, IgM  Result Value Ref Range   Hep B C IgM Negative Negative  Hepatitis B Surface AntiGEN  Result Value Ref Range   Hepatitis B Surface Ag Negative Negative  Lipid panel  Result Value Ref Range   Cholesterol, Total 160 100  - 199 mg/dL   Triglycerides 143 0 - 149 mg/dL   HDL 30 (L) >39 mg/dL   VLDL Cholesterol Cal 26 5 - 40 mg/dL   LDL Chol Calc (NIH) 104 (H) 0 - 99 mg/dL   Chol/HDL Ratio 5.3 (H) 0.0 - 5.0 ratio  Bayer DCA Hb A1c Waived (STAT)  Result Value Ref Range   HB A1C (BAYER DCA - WAIVED) 5.7 (H) 4.8 - 5.6 %        Current Outpatient Medications:    Multiple Vitamin (MULTI-VITAMINS) TABS, Take 1 tablet by mouth daily. , Disp: , Rfl:    Multiple Vitamins-Minerals (ZINC PO), Take 1 tablet by mouth daily. , Disp: , Rfl:    triamcinolone (NASACORT) 55 MCG/ACT AERO nasal inhaler, Place 1 spray into the nose in the morning and at bedtime., Disp: , Rfl:    Vitamin D3 (VITAMIN D) 25 MCG tablet, Take 1,000 Units by mouth daily. , Disp: , Rfl:     Assessment & Plan:   DM a1c stable at 5.7 no med indicated at this time.  urine  microalbumin  diabetic diet plan given to pt  adviced regarding hypoglycemia and instructions given to pt today on how to prevent and treat the same if it were to occur. pt acknowledges the plan and voices understanding of the same.  exercise plan given and encouraged.   advice diabetic yearly podiatry, ophthalmology , nutritionist , dental check q 6 months,   2. Abnl Hep B S ag on screening for blood donation  Will recheck  Order  a hepatitis panel as below     Problem List Items Addressed This Visit       Endocrine   Type 2 diabetes mellitus without complications ([place]) - Primary   Relevant Medications   triamcinolone (NASACORT) 55 MCG/ACT AERO nasal inhaler   Other Relevant Orders   CBC with Differential/Platelet (Completed)   Comprehensive metabolic panel (Completed)   Urinalysis, Routine w reflex microscopic (Completed)   Lipid panel (Completed)   Bayer DCA Hb A1c Waived (STAT) (Completed)     Other   Lower leg edema   Relevant Medications   triamcinolone (NASACORT) 55 MCG/ACT AERO nasal inhaler  Other Relevant Orders   CBC with Differential/Platelet  (Completed)   Comprehensive metabolic panel (Completed)   Urinalysis, Routine w reflex microscopic (Completed)   Lipid panel (Completed)   Other Visit Diagnoses     Positive hepatitis test       Relevant Orders   Hepatitis B core antibody, IgM (Completed)   Hepatitis B Surface AntiGEN (Completed)        Orders Placed This Encounter  Procedures   CBC with Differential/Platelet   Comprehensive metabolic panel   Urinalysis, Routine w reflex microscopic   Lipid panel   Hepatitis B core antibody, IgM   Hepatitis B Surface AntiGEN   Bayer DCA Hb A1c Waived (STAT)     No orders of the defined types were placed in this encounter.    Follow up plan: No follow-ups on file.

## 2021-08-04 LAB — COMPREHENSIVE METABOLIC PANEL
ALT: 30 IU/L (ref 0–44)
AST: 22 IU/L (ref 0–40)
Albumin/Globulin Ratio: 1.5 (ref 1.2–2.2)
Albumin: 4.6 g/dL (ref 3.8–4.8)
Alkaline Phosphatase: 38 IU/L — ABNORMAL LOW (ref 44–121)
BUN/Creatinine Ratio: 15 (ref 10–24)
BUN: 16 mg/dL (ref 8–27)
Bilirubin Total: 0.8 mg/dL (ref 0.0–1.2)
CO2: 20 mmol/L (ref 20–29)
Calcium: 9.6 mg/dL (ref 8.6–10.2)
Chloride: 101 mmol/L (ref 96–106)
Creatinine, Ser: 1.04 mg/dL (ref 0.76–1.27)
Globulin, Total: 3.1 g/dL (ref 1.5–4.5)
Glucose: 120 mg/dL — ABNORMAL HIGH (ref 70–99)
Potassium: 4.1 mmol/L (ref 3.5–5.2)
Sodium: 136 mmol/L (ref 134–144)
Total Protein: 7.7 g/dL (ref 6.0–8.5)
eGFR: 78 mL/min/{1.73_m2} (ref >59)

## 2021-08-04 LAB — CBC WITH DIFFERENTIAL/PLATELET
Basophils Absolute: 0 10*3/uL (ref 0.0–0.2)
Basos: 1 %
EOS (ABSOLUTE): 0.1 10*3/uL (ref 0.0–0.4)
Eos: 2 %
Hematocrit: 48.4 % (ref 37.5–51.0)
Hemoglobin: 16.8 g/dL (ref 13.0–17.7)
Immature Grans (Abs): 0 10*3/uL (ref 0.0–0.1)
Immature Granulocytes: 1 %
Lymphocytes Absolute: 1.3 10*3/uL (ref 0.7–3.1)
Lymphs: 22 %
MCH: 32.5 pg (ref 26.6–33.0)
MCHC: 34.7 g/dL (ref 31.5–35.7)
MCV: 94 fL (ref 79–97)
Monocytes Absolute: 0.5 10*3/uL (ref 0.1–0.9)
Monocytes: 9 %
Neutrophils Absolute: 3.9 10*3/uL (ref 1.4–7.0)
Neutrophils: 65 %
Platelets: 123 10*3/uL — ABNORMAL LOW (ref 150–450)
RBC: 5.17 x10E6/uL (ref 4.14–5.80)
RDW: 12.8 % (ref 11.6–15.4)
WBC: 5.8 10*3/uL (ref 3.4–10.8)

## 2021-08-04 LAB — HEPATITIS B SURFACE ANTIGEN: Hepatitis B Surface Ag: NEGATIVE

## 2021-08-04 LAB — HEPATITIS B CORE ANTIBODY, IGM: Hep B C IgM: NEGATIVE

## 2021-08-04 LAB — LIPID PANEL
Chol/HDL Ratio: 5.3 ratio — ABNORMAL HIGH (ref 0.0–5.0)
Cholesterol, Total: 160 mg/dL (ref 100–199)
HDL: 30 mg/dL — ABNORMAL LOW (ref >39)
LDL Chol Calc (NIH): 104 mg/dL — ABNORMAL HIGH (ref 0–99)
Triglycerides: 143 mg/dL (ref 0–149)
VLDL Cholesterol Cal: 26 mg/dL (ref 5–40)

## 2021-08-04 NOTE — Progress Notes (Signed)
Please let him know hep B Surface antigen and antibody was -ve  If he needs to d/w a hepatologist can refer him to [facility] - Dr. Allen Norris.  Thnx.

## 2021-08-08 ENCOUNTER — Telehealth: Payer: Self-pay

## 2021-08-08 NOTE — Telephone Encounter (Signed)
Spoke with patient, he is happy with the results that were from the test done here. The was informed that he could possible have a false positive by the [employer]. He would though like to check it again in the future.  He also stated that he does not want a referral at this time.

## 2021-08-09 DIAGNOSIS — R768 Other specified abnormal immunological findings in serum: Secondary | ICD-10-CM | POA: Insufficient documentation

## 2021-12-01 DIAGNOSIS — Q828 Other specified congenital malformations of skin: Secondary | ICD-10-CM | POA: Diagnosis not present

## 2021-12-01 DIAGNOSIS — Z85828 Personal history of other malignant neoplasm of skin: Secondary | ICD-10-CM | POA: Diagnosis not present

## 2021-12-01 DIAGNOSIS — D2272 Melanocytic nevi of left lower limb, including hip: Secondary | ICD-10-CM | POA: Diagnosis not present

## 2021-12-01 DIAGNOSIS — D2262 Melanocytic nevi of left upper limb, including shoulder: Secondary | ICD-10-CM | POA: Diagnosis not present

## 2021-12-01 DIAGNOSIS — D2261 Melanocytic nevi of right upper limb, including shoulder: Secondary | ICD-10-CM | POA: Diagnosis not present

## 2021-12-01 DIAGNOSIS — D485 Neoplasm of uncertain behavior of skin: Secondary | ICD-10-CM | POA: Diagnosis not present

## 2021-12-01 DIAGNOSIS — L570 Actinic keratosis: Secondary | ICD-10-CM | POA: Diagnosis not present

## 2022-05-16 DIAGNOSIS — E119 Type 2 diabetes mellitus without complications: Secondary | ICD-10-CM | POA: Diagnosis not present

## 2022-05-16 DIAGNOSIS — B351 Tinea unguium: Secondary | ICD-10-CM | POA: Diagnosis not present

## 2022-06-06 ENCOUNTER — Ambulatory Visit (INDEPENDENT_AMBULATORY_CARE_PROVIDER_SITE_OTHER): Payer: Medicare Other | Admitting: *Deleted

## 2022-06-06 DIAGNOSIS — Z0000 Encounter for general adult medical examination without abnormal findings: Secondary | ICD-10-CM | POA: Diagnosis not present

## 2022-06-06 NOTE — Progress Notes (Signed)
Subjective:   Luke Cain is a 71 y.o. male who presents for Medicare Annual/Subsequent preventive examination.  I connected with  Katherene Ponto on 06/06/22 by a telephone enabled telemedicine application and verified that I am speaking with the correct person using two identifiers.   I discussed the limitations of evaluation and management by telemedicine. The patient expressed understanding and agreed to proceed.  Patient location: home  Provider location:  Tele-health-home    Review of Systems     Cardiac Risk Factors include: advanced age (>28mn, >>72women);male gender;obesity (BMI >30kg/m2);family history of premature cardiovascular disease     Objective:    Today's Vitals   There is no height or weight on file to calculate BMI.     06/06/2022    9:06 AM 06/04/2021    3:31 PM 08/23/2020    2:52 PM 04/02/2020    9:02 AM 01/15/2017    8:04 AM  Advanced Directives  Does Patient Have a Medical Advance Directive? No No No Yes No  Type of ATheatre stage managerof AGu [place] will   Copy of [place] Chart?    No - copy requested   Would patient like information on creating a medical advance directive? No - Patient declined    Yes (MAU/Ambulatory/Procedural Areas - Information given)    Current Medications (verified) Outpatient Encounter Medications as of 06/06/2022  Medication Sig   fexofenadine-pseudoephedrine (ALLEGRA-D) 60-120 MG 12 hr tablet Take 1 tablet by mouth 2 (two) times daily.   Multiple Vitamin (MULTI-VITAMINS) TABS Take 1 tablet by mouth daily.    Multiple Vitamins-Minerals (ZINC PO) Take 1 tablet by mouth daily.    triamcinolone (NASACORT) 55 MCG/ACT AERO nasal inhaler Place 1 spray into the nose in the morning and at bedtime.   Vitamin D3 (VITAMIN D) 25 MCG tablet Take 1,000 Units by mouth daily.    No facility-administered encounter medications on file as of 06/06/2022.    Allergies (verified) Patient has  no known allergies.   History: Past Medical History:  Diagnosis Date   Arthritis    hands, knees   [place]    skin cancer   Pre-diabetes    lost weight and as of 07-14-20 A1C is 5.0   Sleep apnea    Uses Cpap   Past Surgical History:  Procedure Laterality Date   ANAL FISSURE REPAIR     over 30 yrs ago   CARPAL TUNNEL RELEASE  09/15/2020   COLONOSCOPY WITH PROPOFOL N/A 01/15/2017   Procedure: COLONOSCOPY WITH PROPOFOL;  Surgeon: DLucilla Lame MD;  [facility]  Service: Endoscopy;  Laterality: N/A;  sleep apnea   POLYPECTOMY  01/15/2017   Procedure: POLYPECTOMY;  Surgeon: DLucilla Lame MD;  [facility]  Service: Endoscopy;;   UMBILICAL HERNIA REPAIR N/A 09/01/2020   Procedure: HERNIA REPAIR UMBILICAL ADULT;  Surgeon: RRonny Bacon MD;  Location: [facility];  Service: General;  Laterality: N/A;   Family History  Problem Relation Age of Onset   Thyroid disease Mother    Varicose Veins Mother    Congestive Heart Failure Mother    Diabetes Father    Heart disease Father    Heart attack Father    Social History   Socioeconomic History   Marital status: Married    Spouse name: Not on file   Number of children: Not on file   Years of education: Not on file   Highest education level:  Not on file  Occupational History   Occupation: semi retired  Tobacco Use   Smoking status: Former    Packs/day: 1.00    Years: 30.00    Total pack years: 30.00    Types: Cigarettes    Quit date: 04/14/1999    Years since quitting: 23.1   Smokeless tobacco: Former    Quit date: 04/14/1999  Vaping Use   Vaping Use: Never used  Substance and Sexual Activity   Alcohol use: Yes    Alcohol/week: 5.0 - 6.0 standard drinks of alcohol    Types: 5 - 6 Standard drinks or equivalent per week   Drug use: No   Sexual activity: Not on file  Other Topics Concern   Not on file  Social History Narrative   Not on file   Social Determinants of Health    Financial Resource Strain: Low Risk  (06/06/2022)   Overall Financial Resource Strain (CARDIA)    Difficulty of Paying Living Expenses: Not hard at all  Food Insecurity: No Food Insecurity (06/06/2022)   Hunger Vital Sign    Worried About Running Out of Food in the Last Year: Never true    Ran Out of Food in the Last Year: Never true  Transportation Needs: No Transportation Needs (06/06/2022)   PRAPARE - Hydrologist (Medical): No    Lack of Transportation (Non-Medical): No  Physical Activity: Inactive (06/06/2022)   Exercise Vital Sign    Days of Exercise per Week: 0 days    Minutes of Exercise per Session: 0 min  Stress: No Stress Concern Present (06/06/2022)   [place]    Feeling of Stress : Not at all  Social Connections: Moderately Integrated (06/06/2022)   Social Connection and Isolation Panel [NHANES]    Frequency of Communication with Friends and Family: More than three times a week    Frequency of Social Gatherings with Friends and Family: More than three times a week    Attends Religious Services: More than 4 times per year    Active Member of [employer] or Organizations: No    Attends Music therapist: Never    Marital Status: Married    Tobacco Counseling Counseling given: Not Answered   Clinical Intake:  Pre-visit preparation completed: Yes  Pain : No/denies pain     Diabetes: No  How often do you need to have someone help you when you read instructions, pamphlets, or other written materials from your doctor or pharmacy?: 1 - Never  Diabetic?  no  Interpreter Needed?: No  Information entered by :: Leroy Kennedy LPN   Activities of Daily Living    06/06/2022    9:11 AM  In your present state of health, do you have any difficulty performing the following activities:  Hearing? 1  Vision? 0  Difficulty concentrating or making decisions? 0  Walking or  climbing stairs? 0  Dressing or bathing? 0  Doing errands, shopping? 0  Preparing Food and eating ? N  Using the Toilet? N  In the past six months, have you accidently leaked urine? N  Do you have problems with loss of bowel control? N  Managing your Medications? N  Managing your Finances? N  Housekeeping or managing your Housekeeping? N    Patient Care Team: Charlynne Cousins, MD (Inactive) as PCP - General Ericka Pontiff, MD as Referring Physician (Obstetrics and Gynecology) Royston Cowper, MD as Consulting Physician (Urology)  Indicate any recent Medical Services you may have received from other than [facility] providers in the past year (date may be approximate).     Assessment:   This is a routine wellness examination for Braydan.  Hearing/Vision screen Hearing Screening - Comments:: Bilateral hearing aids Vision Screening - Comments:: Up to date Macular Degeneration [place] issues and exercise activities discussed: Current Exercise Habits: The patient does not participate in regular exercise at present   Goals Addressed             This Visit's Progress    Patient Stated       Would like to loose some weight       Depression Screen    06/06/2022    9:13 AM 08/03/2021   10:59 AM 06/04/2021    3:27 PM 01/12/2021    8:55 AM 07/14/2020    9:08 AM 04/02/2020    9:04 AM 03/25/2020    9:40 AM  PHQ 2/9 Scores  PHQ - 2 Score 0 0 0 0 0 0 0  PHQ- 9 Score 0 0   0  2    Fall Risk    06/06/2022    9:06 AM 08/03/2021   10:58 AM 06/04/2021    3:32 PM 01/12/2021    8:55 AM 07/14/2020    9:08 AM  Fall Risk   Falls in the past year? 0 0 0 0 0  Number falls in past yr: 0 0 0 0   Injury with Fall? 0 0 0 0   Risk for fall due to :  No Fall Risks No Fall Risks No Fall Risks   Follow up Falls evaluation completed;Education provided;Falls prevention discussed Falls evaluation completed Falls evaluation completed Falls evaluation completed Falls evaluation  completed    FALL RISK PREVENTION PERTAINING TO THE HOME:  Any stairs in or around the home? Yes  If so, are there any without handrails? No  Home free of loose throw rugs in walkways, pet beds, electrical cords, etc? Yes  Adequate lighting in your home to reduce risk of falls? Yes   ASSISTIVE DEVICES UTILIZED TO PREVENT FALLS:  Life alert? No  Use of a cane, walker or w/c? No  Grab bars in the bathroom? No  Shower chair or bench in shower? No  Elevated toilet seat or a handicapped toilet? Yes   TIMED UP AND GO:  Was the test performed? No .    Cognitive Function:        06/06/2022    9:08 AM 06/04/2021    3:35 PM 04/02/2020    9:05 AM  6CIT Screen  What Year? 0 points 0 points 0 points  What month? 0 points 0 points 0 points  What time? 0 points 0 points 0 points  Count back from 20 0 points 0 points 0 points  Months in reverse 2 points 0 points 4 points  Repeat phrase 2 points 0 points 0 points  Total Score 4 points 0 points 4 points    Immunizations Immunization History  Administered Date(s) Administered   Influenza,inj,Quad PF,6+ Mos 10/24/2016   Influenza-Unspecified 07/05/2020   PFIZER Comirnaty(Gray Top)Covid-19 Tri-Sucrose Vaccine 11/12/2019, 12/04/2019   PFIZER(Purple Top)SARS-COV-2 Vaccination 07/05/2020   Pneumococcal Conjugate-13 03/12/2019   Pneumococcal Polysaccharide-23 07/14/2020   Tdap 05/02/2012   Zoster Recombinat (Shingrix) 01/22/2017    TDAP status: Due, Education has been provided regarding the importance of this vaccine. Advised may receive this vaccine at local pharmacy or Health Dept.  Aware to provide a copy of the vaccination record if obtained from local pharmacy or Health Dept. Verbalized acceptance and understanding.  Flu Vaccine status: Due, Education has been provided regarding the importance of this vaccine. Advised may receive this vaccine at local pharmacy or Health Dept. Aware to provide a copy of the vaccination record if  obtained from local pharmacy or Health Dept. Verbalized acceptance and understanding.  Pneumococcal vaccine status: Up to date  Covid-19 vaccine status: Information provided on how to obtain vaccines.   Qualifies for Shingles Vaccine? Yes   Zostavax completed No   Shingrix Completed?: Yes  Screening Tests Health Maintenance  Topic Date Due   OPHTHALMOLOGY EXAM  Never done   FOOT EXAM  01/12/2022   URINE MICROALBUMIN  01/12/2022   HEMOGLOBIN A1C  01/31/2022   INFLUENZA VACCINE  04/25/2022   Zoster Vaccines- Shingrix (2 of 2) 09/05/2022 (Originally 03/19/2017)   COVID-19 Vaccine (4 - Pfizer series) 06/05/2023 (Originally 08/30/2020)   COLONOSCOPY (Pts 45-6yr Insurance coverage will need to be confirmed)  06/07/2023 (Originally 01/15/2022)   TETANUS/TDAP  06/13/2023 (Originally 05/02/2022)   Pneumonia Vaccine 70 Years old  Completed   Hepatitis C Screening  Completed   HPV VACCINES  Aged Out    Health Maintenance  Health Maintenance Due  Topic Date Due   OPHTHALMOLOGY EXAM  Never done   FOOT EXAM  01/12/2022   URINE MICROALBUMIN  01/12/2022   HEMOGLOBIN A1C  01/31/2022   INFLUENZA VACCINE  04/25/2022    Colorectal cancer screening: Type of screening: Colonoscopy. Completed 2018. Repeat every 5 years  Lung Cancer Screening: (Low Dose CT Chest recommended if Age 70-80years, 30 pack-year currently smoking OR have quit w/in 15years.) does not qualify.   Lung Cancer Screening Referral:   Additional Screening:  Hepatitis C Screening: does not qualify; Completed 2019  Vision Screening: Recommended annual ophthalmology exams for early detection of glaucoma and other disorders of the eye. Is the patient up to date with their annual eye exam?  Yes  Who is the provider or what is the name of the office in which the patient attends annual eye exams? [facility] is not established with a provider, would they like to be referred to a provider to establish care? No .    Dental Screening: Recommended annual dental exams for proper oral hygiene  Community Resource Referral / Chronic Care Management: CRR required this visit?  No   CCM required this visit?  No      Plan:     I have personally reviewed and noted the following in the patient's chart:   Medical and social history Use of alcohol, tobacco or illicit drugs  Current medications and supplements including opioid prescriptions. Patient is not currently taking opioid prescriptions. Functional ability and status Nutritional status Physical activity Advanced directives List of other physicians Hospitalizations, surgeries, and ER visits in previous 12 months Vitals Screenings to include cognitive, depression, and falls Referrals and appointments  In addition, I have reviewed and discussed with patient certain preventive protocols, quality metrics, and best practice recommendations. A written personalized care plan for preventive services as well as general preventive health recommendations were provided to patient.     JLeroy Kennedy LPN   [id number]  Nurse Notes:

## 2022-06-06 NOTE — Patient Instructions (Signed)
Luke Cain , Thank you for taking time to come for your Medicare Wellness Visit. I appreciate your ongoing commitment to your health goals. Please review the following plan we discussed and let me know if I can assist you in the future.   Screening recommendations/referrals: Colonoscopy: Education provided Recommended yearly ophthalmology/optometry visit for glaucoma screening and checkup Recommended yearly dental visit for hygiene and checkup  Vaccinations: Influenza vaccine: Education provided Pneumococcal vaccine: up to date Tdap vaccine: Education provided Shingles vaccine: 1 of 2 Education provided    Advanced directives: Education provided  Conditions/risks identified:     Preventive Care 2 Years and Older, Male Preventive care refers to lifestyle choices and visits with your health care provider that can promote health and wellness. What does preventive care include? A yearly physical exam. This is also called an annual well check. Dental exams once or twice a year. Routine eye exams. Ask your health care provider how often you should have your eyes checked. Personal lifestyle choices, including: Daily care of your teeth and gums. Regular physical activity. Eating a healthy diet. Avoiding tobacco and drug use. Limiting alcohol use. Practicing safe sex. Taking low doses of aspirin every day. Taking vitamin and mineral supplements as recommended by your health care provider. What happens during an annual well check? The services and screenings done by your health care provider during your annual well check will depend on your age, overall health, lifestyle risk factors, and family history of disease. Counseling  Your health care provider may ask you questions about your: Alcohol use. Tobacco use. Drug use. Emotional well-being. Home and relationship well-being. Sexual activity. Eating habits. History of falls. Memory and ability to understand (cognition). Work  and work Statistician. Screening  You may have the following tests or measurements: Height, weight, and BMI. Blood pressure. Lipid and cholesterol levels. These may be checked every 5 years, or more frequently if you are over 67 years old. Skin check. Lung cancer screening. You may have this screening every year starting at age 5 if you have a 30-pack-year history of smoking and currently smoke or have quit within the past 15 years. Fecal occult blood test (FOBT) of the stool. You may have this test every year starting at age 37. Flexible sigmoidoscopy or colonoscopy. You may have a sigmoidoscopy every 5 years or a colonoscopy every 10 years starting at age 71. Prostate cancer screening. Recommendations will vary depending on your family history and other risks. Hepatitis C blood test. Hepatitis B blood test. Sexually transmitted disease (STD) testing. Diabetes screening. This is done by checking your blood sugar (glucose) after you have not eaten for a while (fasting). You may have this done every 1-3 years. Abdominal aortic aneurysm (AAA) screening. You may need this if you are a current or former smoker. Osteoporosis. You may be screened starting at age 17 if you are at high risk. Talk with your health care provider about your test results, treatment options, and if necessary, the need for more tests. Vaccines  Your health care provider may recommend certain vaccines, such as: Influenza vaccine. This is recommended every year. Tetanus, diphtheria, and acellular pertussis (Tdap, Td) vaccine. You may need a Td booster every 10 years. Zoster vaccine. You may need this after age 37. Pneumococcal 13-valent conjugate (PCV13) vaccine. One dose is recommended after age 36. Pneumococcal polysaccharide (PPSV23) vaccine. One dose is recommended after age 61. Talk to your health care provider about which screenings and vaccines you need and how often  you need them. This information is not intended  to replace advice given to you by your health care provider. Make sure you discuss any questions you have with your health care provider. Document Released: 10/08/2015 Document Revised: 05/31/2016 Document Reviewed: 07/13/2015 Elsevier Interactive Patient Education  2017 [place] Prevention in the Home Falls can cause injuries. They can happen to people of all ages. There are many things you can do to make your home safe and to help prevent falls. What can I do on the outside of my home? Regularly fix the edges of walkways and driveways and fix any cracks. Remove anything that might make you trip as you walk through a door, such as a raised step or threshold. Trim any bushes or trees on the path to your home. Use bright outdoor lighting. Clear any walking paths of anything that might make someone trip, such as rocks or tools. Regularly check to see if handrails are loose or broken. Make sure that both sides of any steps have handrails. Any raised decks and porches should have guardrails on the edges. Have any leaves, snow, or ice cleared regularly. Use sand or salt on walking paths during winter. Clean up any spills in your garage right away. This includes oil or grease spills. What can I do in the bathroom? Use night lights. Install grab bars by the toilet and in the tub and shower. Do not use towel bars as grab bars. Use non-skid mats or decals in the tub or shower. If you need to sit down in the shower, use a plastic, non-slip stool. Keep the floor dry. Clean up any water that spills on the floor as soon as it happens. Remove soap buildup in the tub or shower regularly. Attach bath mats securely with double-sided non-slip rug tape. Do not have throw rugs and other things on the floor that can make you trip. What can I do in the bedroom? Use night lights. Make sure that you have a light by your bed that is easy to reach. Do not use any sheets or blankets that are too big  for your bed. They should not hang down onto the floor. Have a firm chair that has side arms. You can use this for support while you get dressed. Do not have throw rugs and other things on the floor that can make you trip. What can I do in the kitchen? Clean up any spills right away. Avoid walking on wet floors. Keep items that you use a lot in easy-to-reach places. If you need to reach something above you, use a strong step stool that has a grab bar. Keep electrical cords out of the way. Do not use floor polish or wax that makes floors slippery. If you must use wax, use non-skid floor wax. Do not have throw rugs and other things on the floor that can make you trip. What can I do with my stairs? Do not leave any items on the stairs. Make sure that there are handrails on both sides of the stairs and use them. Fix handrails that are broken or loose. Make sure that handrails are as long as the stairways. Check any carpeting to make sure that it is firmly attached to the stairs. Fix any carpet that is loose or worn. Avoid having throw rugs at the top or bottom of the stairs. If you do have throw rugs, attach them to the floor with carpet tape. Make sure that you have a light  switch at the top of the stairs and the bottom of the stairs. If you do not have them, ask someone to add them for you. What else can I do to help prevent falls? Wear shoes that: Do not have high heels. Have rubber bottoms. Are comfortable and fit you well. Are closed at the toe. Do not wear sandals. If you use a stepladder: Make sure that it is fully opened. Do not climb a closed stepladder. Make sure that both sides of the stepladder are locked into place. Ask someone to hold it for you, if possible. Clearly mark and make sure that you can see: Any grab bars or handrails. First and last steps. Where the edge of each step is. Use tools that help you move around (mobility aids) if they are needed. These  include: Canes. Walkers. Scooters. Crutches. Turn on the lights when you go into a dark area. Replace any light bulbs as soon as they burn out. Set up your furniture so you have a clear path. Avoid moving your furniture around. If any of your floors are uneven, fix them. If there are any pets around you, be aware of where they are. Review your medicines with your doctor. Some medicines can make you feel dizzy. This can increase your chance of falling. Ask your doctor what other things that you can do to help prevent falls. This information is not intended to replace advice given to you by your health care provider. Make sure you discuss any questions you have with your health care provider. Document Released: 07/08/2009 Document Revised: 02/17/2016 Document Reviewed: 10/16/2014 Elsevier Interactive Patient Education  2017 [employer].

## 2022-07-20 ENCOUNTER — Telehealth: Payer: Self-pay | Admitting: Nurse Practitioner

## 2022-07-20 NOTE — Telephone Encounter (Signed)
Called patient to schedule a follow up with new provider. Patient stated that he can not order parts for his C PAP machine without one. Please advise.

## 2022-08-07 DIAGNOSIS — E118 Type 2 diabetes mellitus with unspecified complications: Secondary | ICD-10-CM | POA: Insufficient documentation

## 2022-08-07 NOTE — Progress Notes (Deleted)
There were no vitals taken for this visit.   Subjective:    Patient ID: Luke Cain, male    DOB: 02-23-1951, 71 y.o.   MRN: [id number]  HPI: [name] is a 71 y.o. male  No chief complaint on file.  DIABETES Hypoglycemic episodes:{Blank single:19197::"yes","no"} Polydipsia/polyuria: {Blank single:19197::"yes","no"} Visual disturbance: {Blank single:19197::"yes","no"} Chest pain: {Blank single:19197::"yes","no"} Paresthesias: {Blank single:19197::"yes","no"} Glucose Monitoring: {Blank single:19197::"yes","no"}  Accucheck frequency: {Blank single:19197::"Not Checking","Daily","BID","TID"}  Fasting glucose:  Post prandial:  Evening:  Before meals: Taking Insulin?: {Blank single:19197::"yes","no"}  Long acting insulin:  Short acting insulin: Blood Pressure Monitoring: {Blank single:19197::"not checking","rarely","daily","weekly","monthly","a few times a day","a few times a week","a few times a month"} Retinal Examination: {Blank single:19197::"Up to Date","Not up to Date"} Foot Exam: {Blank single:19197::"Up to Date","Not up to Date"} Diabetic Education: {Blank single:19197::"Completed","Not Completed"} Pneumovax: {Blank single:19197::"Up to Date","Not up to Date","unknown"} Influenza: {Blank single:19197::"Up to Date","Not up to Date","unknown"} Aspirin: {Blank single:19197::"yes","no"}  Relevant past medical, surgical, family and social history reviewed and updated as indicated. Interim medical history since our last visit reviewed. Allergies and medications reviewed and updated.  Review of Systems  Per HPI unless specifically indicated above     Objective:    There were no vitals taken for this visit.  Wt Readings from Last 3 Encounters:  08/03/21 233 lb 6.4 oz (105.9 kg)  01/12/21 216 lb 3.2 oz (98.1 kg)  09/16/20 223 lb 3.2 oz (101.2 kg)    Physical Exam  Results for orders placed or performed in visit on 08/03/21  CBC with Differential/Platelet   Result Value Ref Range   WBC 5.8 3.4 - 10.8 x10E3/uL   RBC 5.17 4.14 - 5.80 x10E6/uL   Hemoglobin 16.8 13.0 - 17.7 g/dL   Hematocrit 48.4 37.5 - 51.0 %   MCV 94 79 - 97 fL   MCH 32.5 26.6 - 33.0 pg   MCHC 34.7 31.5 - 35.7 g/dL   RDW 12.8 11.6 - 15.4 %   Platelets 123 (L) 150 - 450 x10E3/uL   Neutrophils 65 Not Estab. %   Lymphs 22 Not Estab. %   Monocytes 9 Not Estab. %   Eos 2 Not Estab. %   Basos 1 Not Estab. %   Neutrophils Absolute 3.9 1.4 - 7.0 x10E3/uL   Lymphocytes Absolute 1.3 0.7 - 3.1 x10E3/uL   Monocytes Absolute 0.5 0.1 - 0.9 x10E3/uL   EOS (ABSOLUTE) 0.1 0.0 - 0.4 x10E3/uL   Basophils Absolute 0.0 0.0 - 0.2 x10E3/uL   Immature Granulocytes 1 Not Estab. %   Immature Grans (Abs) 0.0 0.0 - 0.1 x10E3/uL  Comprehensive metabolic panel  Result Value Ref Range   Glucose 120 (H) 70 - 99 mg/dL   BUN 16 8 - 27 mg/dL   Creatinine, Ser 1.04 0.76 - 1.27 mg/dL   eGFR 78 >59 mL/min/1.73   BUN/Creatinine Ratio 15 10 - 24   Sodium 136 134 - 144 mmol/L   Potassium 4.1 3.5 - 5.2 mmol/L   Chloride 101 96 - 106 mmol/L   CO2 20 20 - 29 mmol/L   Calcium 9.6 8.6 - 10.2 mg/dL   Total Protein 7.7 6.0 - 8.5 g/dL   Albumin 4.6 3.8 - 4.8 g/dL   Globulin, Total 3.1 1.5 - 4.5 g/dL   Albumin/Globulin Ratio 1.5 1.2 - 2.2   Bilirubin Total 0.8 0.0 - 1.2 mg/dL   Alkaline Phosphatase 38 (L) 44 - 121 IU/L   AST 22 0 - 40 IU/L   ALT 30 0 - 44  IU/L  Urinalysis, Routine w reflex microscopic  Result Value Ref Range   Specific Gravity, UA 1.015 1.005 - 1.030   pH, UA 6.0 5.0 - 7.5   Color, UA Yellow Yellow   Appearance Ur Clear Clear   Leukocytes,UA Negative Negative   Protein,UA Negative Negative/Trace   Glucose, UA Negative Negative   Ketones, UA Negative Negative   RBC, UA Negative Negative   Bilirubin, UA Negative Negative   Urobilinogen, Ur 0.2 0.2 - 1.0 mg/dL   Nitrite, UA Negative Negative  Hepatitis B core antibody, IgM  Result Value Ref Range   Hep B C IgM Negative Negative   Hepatitis B Surface AntiGEN  Result Value Ref Range   Hepatitis B Surface Ag Negative Negative  Lipid panel  Result Value Ref Range   Cholesterol, Total 160 100 - 199 mg/dL   Triglycerides 143 0 - 149 mg/dL   HDL 30 (L) >39 mg/dL   VLDL Cholesterol Cal 26 5 - 40 mg/dL   LDL Chol Calc (NIH) 104 (H) 0 - 99 mg/dL   Chol/HDL Ratio 5.3 (H) 0.0 - 5.0 ratio  Bayer DCA Hb A1c Waived (STAT)  Result Value Ref Range   HB A1C (BAYER DCA - WAIVED) 5.7 (H) 4.8 - 5.6 %      Assessment & Plan:   Problem List Items Addressed This Visit       Endocrine   Type 2 diabetes mellitus without complications ([place]) - Primary     Follow up plan: No follow-ups on file.

## 2022-08-08 ENCOUNTER — Ambulatory Visit: Payer: Medicare Other | Admitting: Nurse Practitioner

## 2022-08-23 NOTE — Progress Notes (Unsigned)
There were no vitals taken for this visit.   Subjective:    Patient ID: Luke Cain, male    DOB: 1951-09-01, 71 y.o.   MRN: [id number]  HPI: Luke Cain is a 71 y.o. male  No chief complaint on file.  DIABETES Hypoglycemic episodes:{Blank single:19197::"yes","no"} Polydipsia/polyuria: {Blank single:19197::"yes","no"} Visual disturbance: {Blank single:19197::"yes","no"} Chest pain: {Blank single:19197::"yes","no"} Paresthesias: {Blank single:19197::"yes","no"} Glucose Monitoring: {Blank single:19197::"yes","no"}  Accucheck frequency: {Blank single:19197::"Not Checking","Daily","BID","TID"}  Fasting glucose:  Post prandial:  Evening:  Before meals: Taking Insulin?: {Blank single:19197::"yes","no"}  Long acting insulin:  Short acting insulin: Blood Pressure Monitoring: {Blank single:19197::"not checking","rarely","daily","weekly","monthly","a few times a day","a few times a week","a few times a month"} Retinal Examination: {Blank single:19197::"Up to Date","Not up to Date"} Foot Exam: {Blank single:19197::"Up to Date","Not up to Date"} Diabetic Education: {Blank single:19197::"Completed","Not Completed"} Pneumovax: {Blank single:19197::"Up to Date","Not up to Date","unknown"} Influenza: {Blank single:19197::"Up to Date","Not up to Date","unknown"} Aspirin: {Blank single:19197::"yes","no"}  Relevant past medical, surgical, family and social history reviewed and updated as indicated. Interim medical history since our last visit reviewed. Allergies and medications reviewed and updated.  Review of Systems  Per HPI unless specifically indicated above     Objective:    There were no vitals taken for this visit.  Wt Readings from Last 3 Encounters:  08/03/21 233 lb 6.4 oz (105.9 kg)  01/12/21 216 lb 3.2 oz (98.1 kg)  09/16/20 223 lb 3.2 oz (101.2 kg)    Physical Exam  Results for orders placed or performed in visit on 08/03/21  CBC with Differential/Platelet   Result Value Ref Range   WBC 5.8 3.4 - 10.8 x10E3/uL   RBC 5.17 4.14 - 5.80 x10E6/uL   Hemoglobin 16.8 13.0 - 17.7 g/dL   Hematocrit 48.4 37.5 - 51.0 %   MCV 94 79 - 97 fL   MCH 32.5 26.6 - 33.0 pg   MCHC 34.7 31.5 - 35.7 g/dL   RDW 12.8 11.6 - 15.4 %   Platelets 123 (L) 150 - 450 x10E3/uL   Neutrophils 65 Not Estab. %   Lymphs 22 Not Estab. %   Monocytes 9 Not Estab. %   Eos 2 Not Estab. %   Basos 1 Not Estab. %   Neutrophils Absolute 3.9 1.4 - 7.0 x10E3/uL   Lymphocytes Absolute 1.3 0.7 - 3.1 x10E3/uL   Monocytes Absolute 0.5 0.1 - 0.9 x10E3/uL   EOS (ABSOLUTE) 0.1 0.0 - 0.4 x10E3/uL   Basophils Absolute 0.0 0.0 - 0.2 x10E3/uL   Immature Granulocytes 1 Not Estab. %   Immature Grans (Abs) 0.0 0.0 - 0.1 x10E3/uL  Comprehensive metabolic panel  Result Value Ref Range   Glucose 120 (H) 70 - 99 mg/dL   BUN 16 8 - 27 mg/dL   Creatinine, Ser 1.04 0.76 - 1.27 mg/dL   eGFR 78 >59 mL/min/1.73   BUN/Creatinine Ratio 15 10 - 24   Sodium 136 134 - 144 mmol/L   Potassium 4.1 3.5 - 5.2 mmol/L   Chloride 101 96 - 106 mmol/L   CO2 20 20 - 29 mmol/L   Calcium 9.6 8.6 - 10.2 mg/dL   Total Protein 7.7 6.0 - 8.5 g/dL   Albumin 4.6 3.8 - 4.8 g/dL   Globulin, Total 3.1 1.5 - 4.5 g/dL   Albumin/Globulin Ratio 1.5 1.2 - 2.2   Bilirubin Total 0.8 0.0 - 1.2 mg/dL   Alkaline Phosphatase 38 (L) 44 - 121 IU/L   AST 22 0 - 40 IU/L   ALT 30 0 - 44  IU/L  Urinalysis, Routine w reflex microscopic  Result Value Ref Range   Specific Gravity, UA 1.015 1.005 - 1.030   pH, UA 6.0 5.0 - 7.5   Color, UA Yellow Yellow   Appearance Ur Clear Clear   Leukocytes,UA Negative Negative   Protein,UA Negative Negative/Trace   Glucose, UA Negative Negative   Ketones, UA Negative Negative   RBC, UA Negative Negative   Bilirubin, UA Negative Negative   Urobilinogen, Ur 0.2 0.2 - 1.0 mg/dL   Nitrite, UA Negative Negative  Hepatitis B core antibody, IgM  Result Value Ref Range   Hep B C IgM Negative Negative   Hepatitis B Surface AntiGEN  Result Value Ref Range   Hepatitis B Surface Ag Negative Negative  Lipid panel  Result Value Ref Range   Cholesterol, Total 160 100 - 199 mg/dL   Triglycerides 143 0 - 149 mg/dL   HDL 30 (L) >39 mg/dL   VLDL Cholesterol Cal 26 5 - 40 mg/dL   LDL Chol Calc (NIH) 104 (H) 0 - 99 mg/dL   Chol/HDL Ratio 5.3 (H) 0.0 - 5.0 ratio  Bayer DCA Hb A1c Waived (STAT)  Result Value Ref Range   HB A1C (BAYER DCA - WAIVED) 5.7 (H) 4.8 - 5.6 %      Assessment & Plan:   Problem List Items Addressed This Visit   None    Follow up plan: No follow-ups on file.

## 2022-08-24 ENCOUNTER — Telehealth: Payer: Self-pay | Admitting: Nurse Practitioner

## 2022-08-24 ENCOUNTER — Other Ambulatory Visit: Payer: Self-pay

## 2022-08-24 ENCOUNTER — Ambulatory Visit (INDEPENDENT_AMBULATORY_CARE_PROVIDER_SITE_OTHER): Payer: Medicare Other | Admitting: Nurse Practitioner

## 2022-08-24 ENCOUNTER — Encounter: Payer: Self-pay | Admitting: Nurse Practitioner

## 2022-08-24 ENCOUNTER — Telehealth: Payer: Self-pay

## 2022-08-24 VITALS — BP 142/84 | HR 66 | Temp 98.9°F | Wt 234.8 lb

## 2022-08-24 DIAGNOSIS — D696 Thrombocytopenia, unspecified: Secondary | ICD-10-CM | POA: Diagnosis not present

## 2022-08-24 DIAGNOSIS — K621 Rectal polyp: Secondary | ICD-10-CM

## 2022-08-24 DIAGNOSIS — Z1211 Encounter for screening for malignant neoplasm of colon: Secondary | ICD-10-CM

## 2022-08-24 DIAGNOSIS — R030 Elevated blood-pressure reading, without diagnosis of hypertension: Secondary | ICD-10-CM

## 2022-08-24 DIAGNOSIS — E118 Type 2 diabetes mellitus with unspecified complications: Secondary | ICD-10-CM

## 2022-08-24 DIAGNOSIS — E785 Hyperlipidemia, unspecified: Secondary | ICD-10-CM | POA: Diagnosis not present

## 2022-08-24 DIAGNOSIS — E1169 Type 2 diabetes mellitus with other specified complication: Secondary | ICD-10-CM

## 2022-08-24 LAB — MICROALBUMIN, URINE WAIVED
Creatinine, Urine Waived: 50 mg/dL (ref 10–300)
Microalb, Ur Waived: 30 mg/L — ABNORMAL HIGH (ref 0–19)

## 2022-08-24 MED ORDER — NA SULFATE-K SULFATE-MG SULF 17.5-3.13-1.6 GM/177ML PO SOLN: 1.0000 | Freq: Once | ORAL | 0 refills | Status: AC

## 2022-08-24 NOTE — Telephone Encounter (Signed)
Gastroenterology Pre-Procedure Review  Request Date: 09/26/22 Requesting Physician: Dr. Allen Norris  PATIENT REVIEW QUESTIONS: The patient responded to the following health history questions as indicated:    1. Are you having any GI issues? no 2. Do you have a personal history of Polyps? yes (history of rectal polyp 2018 Dr. Allen Norris) 3. Do you have a family history of Colon Cancer or Polyps? no 4. Diabetes Mellitus? no 5. Joint replacements in the past 12 months?no 6. Major health problems in the past 3 months?no 7. Any artificial heart valves, MVP, or defibrillator?no    MEDICATIONS & ALLERGIES:    Patient reports the following regarding taking any anticoagulation/antiplatelet therapy:   Plavix, Coumadin, Eliquis, Xarelto, Lovenox, Pradaxa, Brilinta, or Effient? no Aspirin? no  Patient confirms/reports the following medications:  Current Outpatient Medications  Medication Sig Dispense Refill   fexofenadine-pseudoephedrine (ALLEGRA-D) 60-120 MG 12 hr tablet Take 1 tablet by mouth 2 (two) times daily.     sildenafil (VIAGRA) 100 MG tablet Take 100 mg by mouth daily as needed for erectile dysfunction.     Vitamin D3 (VITAMIN D) 25 MCG tablet Take 1,000 Units by mouth daily.      No current facility-administered medications for this visit.    Patient confirms/reports the following allergies:  No Known Allergies  No orders of the defined types were placed in this encounter.   AUTHORIZATION INFORMATION Primary Insurance: 1D#: Group #:  Secondary Insurance: 1D#: Group #:  SCHEDULE INFORMATION: Date:  Time: Location:

## 2022-08-24 NOTE — Assessment & Plan Note (Signed)
Labs ordered at visit today.  Will make recommendations based on lab results.   

## 2022-08-24 NOTE — Telephone Encounter (Signed)
Called and spoke to patient. He states that he needs a prescription for a travel CPAP machine, the [facility] and supplies. Patient states that his last sleep study was done over 10 years ago but patient states he pays out of pocket for his machine and supplies.   Order written and placed in providers folder for signature.

## 2022-08-24 NOTE — Telephone Encounter (Signed)
I need more information regarding patient's CPAP. I need to know the manufacturer, what parts, where he wants it sent and what settings he uses.

## 2022-08-24 NOTE — Telephone Encounter (Signed)
PT is requesting prescription for CPAP machine as he is wanting to order parts regarding it and they stated that he needed a prescription.  Looked in the chart and it is encounters regarding the travel CPAP that he is wanting the prescription for.  Please advise.

## 2022-08-24 NOTE — Assessment & Plan Note (Signed)
Chronic.  Controlled with diet.  Declines medication.  Labs ordered today.  Return to clinic in 6 months for reevaluation.  Call sooner if concerns arise.

## 2022-08-24 NOTE — Progress Notes (Signed)
Hi Luke Cain.  Your microalbumin did show that you are dumping some protein in your urine.  This is likely due to your intermittent fasting.  We will keep an eye on this in the future.

## 2022-08-24 NOTE — Assessment & Plan Note (Signed)
Chronic.  Controlled without medication.  Controls diet with Intermittent fasting.  Labs ordered today.  Return to clinic in 6 months for reevaluation.  Call sooner if concerns arise.

## 2022-08-25 LAB — COMPREHENSIVE METABOLIC PANEL
ALT: 25 IU/L (ref 0–44)
AST: 25 IU/L (ref 0–40)
Albumin/Globulin Ratio: 1.6 (ref 1.2–2.2)
Albumin: 4.7 g/dL (ref 3.9–4.9)
Alkaline Phosphatase: 36 IU/L — ABNORMAL LOW (ref 44–121)
BUN/Creatinine Ratio: 18 (ref 10–24)
BUN: 20 mg/dL (ref 8–27)
Bilirubin Total: 0.4 mg/dL (ref 0.0–1.2)
CO2: 21 mmol/L (ref 20–29)
Calcium: 9.7 mg/dL (ref 8.6–10.2)
Chloride: 100 mmol/L (ref 96–106)
Creatinine, Ser: 1.13 mg/dL (ref 0.76–1.27)
Globulin, Total: 2.9 g/dL (ref 1.5–4.5)
Glucose: 139 mg/dL — ABNORMAL HIGH (ref 70–99)
Potassium: 4.1 mmol/L (ref 3.5–5.2)
Sodium: 137 mmol/L (ref 134–144)
Total Protein: 7.6 g/dL (ref 6.0–8.5)
eGFR: 70 mL/min/{1.73_m2} (ref >59)

## 2022-08-25 LAB — HEMOGLOBIN A1C
Est. average glucose Bld gHb Est-mCnc: 126 mg/dL
Hgb A1c MFr Bld: 6 % — ABNORMAL HIGH (ref 4.8–5.6)

## 2022-08-25 LAB — CBC WITH DIFFERENTIAL/PLATELET
Basophils Absolute: 0.1 10*3/uL (ref 0.0–0.2)
Basos: 1 %
EOS (ABSOLUTE): 0.5 10*3/uL — ABNORMAL HIGH (ref 0.0–0.4)
Eos: 8 %
Hematocrit: 45.8 % (ref 37.5–51.0)
Hemoglobin: 15.6 g/dL (ref 13.0–17.7)
Immature Grans (Abs): 0 10*3/uL (ref 0.0–0.1)
Immature Granulocytes: 0 %
Lymphocytes Absolute: 1.1 10*3/uL (ref 0.7–3.1)
Lymphs: 19 %
MCH: 32.8 pg (ref 26.6–33.0)
MCHC: 34.1 g/dL (ref 31.5–35.7)
MCV: 96 fL (ref 79–97)
Monocytes Absolute: 0.5 10*3/uL (ref 0.1–0.9)
Monocytes: 8 %
Neutrophils Absolute: 3.9 10*3/uL (ref 1.4–7.0)
Neutrophils: 64 %
Platelets: 114 10*3/uL — ABNORMAL LOW (ref 150–450)
RBC: 4.75 x10E6/uL (ref 4.14–5.80)
RDW: 13.2 % (ref 11.6–15.4)
WBC: 6 10*3/uL (ref 3.4–10.8)

## 2022-08-25 LAB — LIPID PANEL
Chol/HDL Ratio: 5.1 ratio — ABNORMAL HIGH (ref 0.0–5.0)
Cholesterol, Total: 173 mg/dL (ref 100–199)
HDL: 34 mg/dL — ABNORMAL LOW (ref >39)
LDL Chol Calc (NIH): 121 mg/dL — ABNORMAL HIGH (ref 0–99)
Triglycerides: 98 mg/dL (ref 0–149)
VLDL Cholesterol Cal: 18 mg/dL (ref 5–40)

## 2022-08-25 NOTE — Progress Notes (Signed)
Hi Luke Cain. It was nice to meet you yesterday.  Your lab work looks good.  Your A1c is well controlled at 6.0.  You LDL (bad cholesterol) is elevated from prior.  I recommend a low fat diet and exercise.  No other concerns at this time. Continue with your current medication regimen.  Follow up as discussed.  Please let me know if you have any questions.

## 2022-08-25 NOTE — Telephone Encounter (Signed)
Called and let patient know that the prescription has been signed and is ready to be picked up. Patient states that his daughter will be coming by to pick this up.

## 2022-09-26 ENCOUNTER — Encounter: Admission: RE | Payer: Self-pay | Source: Home / Self Care

## 2022-09-26 ENCOUNTER — Ambulatory Visit: Admission: RE | Admit: 2022-09-26 | Payer: Medicare Other | Source: Home / Self Care | Admitting: Gastroenterology

## 2022-09-26 SURGERY — COLONOSCOPY WITH PROPOFOL
Anesthesia: General

## 2022-10-23 ENCOUNTER — Ambulatory Visit: Payer: Self-pay | Admitting: *Deleted

## 2022-10-23 ENCOUNTER — Encounter: Payer: Self-pay | Admitting: Nurse Practitioner

## 2022-10-23 DIAGNOSIS — N529 Male erectile dysfunction, unspecified: Secondary | ICD-10-CM

## 2022-10-23 NOTE — Telephone Encounter (Signed)
Per agent  "The patient would like to speak with a member of clinical staff regarding a potential prescription for their urinary dysfunction medication  The patient shares that the prescription was previously written by their urologist who has since retired  The patient will be traveling and would like the prescription prescribed by their PCP  Please contact further when possible"      Chief Complaint: TriMix Symptoms: NA Frequency: nA Pertinent Negatives: Patient denies nA Disposition: '[]'$ ED /'[]'$ Urgent Care (no appt availability in office) / '[]'$ Appointment(In office/virtual)/ '[]'$  [facility]/ '[]'$ Home Care/ '[]'$ Refused Recommended Disposition /'[]'$ [facility]/ '[x]'$  Follow-up with PCP Additional Notes: Pt left MyChart message earlier, calling to follow up. Requesting refill of TriMix. States is leaving town Thursday and would like to pick up Wednesday if possible. States has received in past from urologist, no longer has urologist.  Please advise  Reason for Disposition  [1] Caller has medicine question about med NOT prescribed by PCP AND [2] triager unable to answer question (e.g., compatibility with other med, storage)  Answer Assessment - Initial Assessment Questions 1. NAME of MEDICINE: "What medicine(s) are you calling about?"     TriMix 2. QUESTION: "What is your question?" (e.g., double dose of medicine, side effect)     Historical provider, would like PCP to refill 3. PRESCRIBER: "Who prescribed the medicine?" Reason: if prescribed by specialist, call should be referred to that group.     Urologist  Protocols used: Medication Question Call-A-AH

## 2022-10-25 DIAGNOSIS — N529 Male erectile dysfunction, unspecified: Secondary | ICD-10-CM | POA: Diagnosis not present

## 2022-10-25 DIAGNOSIS — Z0000 Encounter for general adult medical examination without abnormal findings: Secondary | ICD-10-CM | POA: Diagnosis not present

## 2022-10-25 NOTE — Telephone Encounter (Signed)
Patient was responded to via Mychart message. Nothing further needed on this call documentation.

## 2022-10-25 NOTE — Telephone Encounter (Signed)
Is this something you would be able to write? TriMix?

## 2023-01-19 DIAGNOSIS — R600 Localized edema: Secondary | ICD-10-CM | POA: Diagnosis not present

## 2023-01-19 DIAGNOSIS — Z125 Encounter for screening for malignant neoplasm of prostate: Secondary | ICD-10-CM | POA: Diagnosis not present

## 2023-01-29 DIAGNOSIS — E1142 Type 2 diabetes mellitus with diabetic polyneuropathy: Secondary | ICD-10-CM | POA: Diagnosis not present

## 2023-01-29 DIAGNOSIS — Z0000 Encounter for general adult medical examination without abnormal findings: Secondary | ICD-10-CM | POA: Diagnosis not present

## 2023-01-29 DIAGNOSIS — R600 Localized edema: Secondary | ICD-10-CM | POA: Diagnosis not present

## 2023-01-29 DIAGNOSIS — Z79890 Hormone replacement therapy (postmenopausal): Secondary | ICD-10-CM | POA: Diagnosis not present

## 2023-01-29 DIAGNOSIS — R0609 Other forms of dyspnea: Secondary | ICD-10-CM | POA: Diagnosis not present

## 2023-01-29 DIAGNOSIS — G4730 Sleep apnea, unspecified: Secondary | ICD-10-CM | POA: Diagnosis not present

## 2023-01-29 DIAGNOSIS — G609 Hereditary and idiopathic neuropathy, unspecified: Secondary | ICD-10-CM | POA: Diagnosis not present

## 2023-01-29 DIAGNOSIS — E785 Hyperlipidemia, unspecified: Secondary | ICD-10-CM | POA: Diagnosis not present

## 2023-01-29 DIAGNOSIS — M25562 Pain in left knee: Secondary | ICD-10-CM | POA: Diagnosis not present

## 2023-02-14 DIAGNOSIS — M1712 Unilateral primary osteoarthritis, left knee: Secondary | ICD-10-CM | POA: Diagnosis not present

## 2023-02-14 DIAGNOSIS — M25511 Pain in right shoulder: Secondary | ICD-10-CM | POA: Diagnosis not present

## 2023-02-14 DIAGNOSIS — M25562 Pain in left knee: Secondary | ICD-10-CM | POA: Diagnosis not present

## 2023-02-14 DIAGNOSIS — M67813 Other specified disorders of tendon, right shoulder: Secondary | ICD-10-CM | POA: Diagnosis not present

## 2023-02-23 DIAGNOSIS — G629 Polyneuropathy, unspecified: Secondary | ICD-10-CM | POA: Diagnosis not present

## 2023-02-26 ENCOUNTER — Ambulatory Visit: Payer: Medicare Other | Admitting: Nurse Practitioner
